# Patient Record
Sex: Male | Born: 1980 | Race: White | Hispanic: No | Marital: Single | Smoking: Current every day smoker
Health system: Southern US, Community
[De-identification: ages and names within clinical notes are randomized; demographics above are authoritative.]

## PROBLEM LIST (undated history)

## (undated) DIAGNOSIS — M24419 Recurrent dislocation, unspecified shoulder: Secondary | ICD-10-CM

## (undated) DIAGNOSIS — S069X9A Unspecified intracranial injury with loss of consciousness of unspecified duration, initial encounter: Secondary | ICD-10-CM

## (undated) DIAGNOSIS — Z87898 Personal history of other specified conditions: Secondary | ICD-10-CM

## (undated) DIAGNOSIS — S069XAA Unspecified intracranial injury with loss of consciousness status unknown, initial encounter: Secondary | ICD-10-CM

## (undated) HISTORY — PX: NO PAST SURGERIES: SHX2092

---

## 1999-10-24 ENCOUNTER — Encounter: Payer: Self-pay | Admitting: Family Medicine

## 1999-10-24 ENCOUNTER — Ambulatory Visit (HOSPITAL_COMMUNITY): Admission: RE | Admit: 1999-10-24 | Discharge: 1999-10-24 | Payer: Self-pay | Admitting: Family Medicine

## 2000-01-20 DIAGNOSIS — Z87898 Personal history of other specified conditions: Secondary | ICD-10-CM

## 2000-01-20 HISTORY — DX: Personal history of other specified conditions: Z87.898

## 2001-04-12 ENCOUNTER — Encounter: Admission: RE | Admit: 2001-04-12 | Discharge: 2001-07-11 | Payer: Self-pay | Admitting: Internal Medicine

## 2001-07-12 ENCOUNTER — Encounter: Admission: RE | Admit: 2001-07-12 | Discharge: 2001-10-10 | Payer: Self-pay | Admitting: Internal Medicine

## 2001-10-11 ENCOUNTER — Encounter: Admission: RE | Admit: 2001-10-11 | Discharge: 2001-11-22 | Payer: Self-pay | Admitting: Internal Medicine

## 2001-11-23 ENCOUNTER — Encounter: Admission: RE | Admit: 2001-11-23 | Discharge: 2002-02-21 | Payer: Self-pay | Admitting: Internal Medicine

## 2002-09-29 ENCOUNTER — Encounter: Admission: RE | Admit: 2002-09-29 | Discharge: 2002-12-28 | Payer: Self-pay

## 2005-06-08 ENCOUNTER — Ambulatory Visit: Payer: Self-pay | Admitting: Psychology

## 2005-06-08 ENCOUNTER — Encounter: Admission: RE | Admit: 2005-06-08 | Discharge: 2005-09-06 | Payer: Self-pay | Admitting: Psychology

## 2006-01-02 ENCOUNTER — Inpatient Hospital Stay (HOSPITAL_COMMUNITY): Admission: AD | Admit: 2006-01-02 | Discharge: 2006-01-09 | Payer: Self-pay | Admitting: *Deleted

## 2006-01-02 ENCOUNTER — Ambulatory Visit: Payer: Self-pay | Admitting: *Deleted

## 2010-09-02 ENCOUNTER — Emergency Department (HOSPITAL_COMMUNITY)
Admission: EM | Admit: 2010-09-02 | Discharge: 2010-09-02 | Disposition: A | Discharge status: Home / Self Care | Payer: Medicaid Other | Attending: Emergency Medicine | Admitting: Emergency Medicine

## 2010-09-02 DIAGNOSIS — L255 Unspecified contact dermatitis due to plants, except food: Secondary | ICD-10-CM | POA: Insufficient documentation

## 2010-09-02 DIAGNOSIS — T622X1A Toxic effect of other ingested (parts of) plant(s), accidental (unintentional), initial encounter: Secondary | ICD-10-CM | POA: Insufficient documentation

## 2011-01-21 ENCOUNTER — Emergency Department (HOSPITAL_COMMUNITY)
Admission: EM | Admit: 2011-01-21 | Discharge: 2011-01-22 | Discharge status: Against Medical Advice | Payer: Medicare Other | Attending: Emergency Medicine | Admitting: Emergency Medicine

## 2011-01-21 DIAGNOSIS — Z0389 Encounter for observation for other suspected diseases and conditions ruled out: Secondary | ICD-10-CM | POA: Insufficient documentation

## 2011-04-03 ENCOUNTER — Encounter (HOSPITAL_COMMUNITY): Payer: Self-pay

## 2011-04-03 ENCOUNTER — Emergency Department (HOSPITAL_COMMUNITY)
Admission: EM | Admit: 2011-04-03 | Discharge: 2011-04-03 | Disposition: A | Discharge status: Home / Self Care | Payer: Medicaid Other | Source: Home / Self Care | Attending: Family Medicine | Admitting: Family Medicine

## 2011-04-03 DIAGNOSIS — J029 Acute pharyngitis, unspecified: Secondary | ICD-10-CM

## 2011-04-03 LAB — POCT RAPID STREP A: Streptococcus, Group A Screen (Direct): NEGATIVE

## 2011-04-03 NOTE — Discharge Instructions (Signed)
Tylenol or ibuprofen as needed for discomfort. May use over-the-counter throat lozenges or Chloraseptic Spray as needed. Increase fluids. Return if symptoms change or worsen, or if no improvement in one week.  Sore Throat A sore throat is felt inside the throat and at the back of the mouth. It hurts to swallow or the throat may feel dry and scratchy. It can be caused by germs, smoking, pollution, or allergies.  HOME CARE   Only take medicine as told by your doctor.   Drink enough fluids to keep your pee (urine) clear or pale yellow.   Eat soft foods.   Do not smoke.   Rinse the mouth (gargle) with warm water or salt water ( teaspoon salt in 8 ounces of water).   Try throat sprays, lozenges, or suck on hard candy.  GET HELP RIGHT AWAY IF:   You have trouble breathing.   Your sore throat lasts longer than 1 week.   There is more puffiness (swelling) in the throat.   The pain is so bad that you are unable to swallow.   You have a very bad headache or a red rash.   You start to throw up (vomit).   You or your child has a temperature by mouth above 102 F (38.9 C), not controlled by medicine.   Your baby is older than 3 months with a rectal temperature of 102 F (38.9 C) or higher.   Your baby is 67 months old or younger with a rectal temperature of 100.4 F (38 C) or higher.  MAKE SURE YOU:   Understand these instructions.   Will watch your condition.   Will get help right away if you are not doing well or get worse.  Document Released: 10/15/2007 Document Revised: 12/25/2010 Document Reviewed: 10/15/2007 [facility] Patient Information 2012 [place], [place].

## 2011-04-03 NOTE — ED Provider Notes (Signed)
History     CSN: [id number]  Arrival date & time 04/03/11  1955   None     Chief Complaint  Patient presents with  . Sore Throat    (Consider location/radiation/quality/duration/timing/severity/associated sxs/prior treatment) HPI Comments: Emily presents today with complaints of sore throat and swollen lymph nodes for the last 3 days. He states that the sore throat worsens with swallowing. He denies any nasal congestion, postnasal drainage or cough. He has not taken anything for his symptoms.   History reviewed. No pertinent past medical history.  History reviewed. No pertinent past surgical history.  History reviewed. No pertinent family history.  History  Substance Use Topics  . Smoking status: Current Everyday Smoker  . Smokeless tobacco: Not on file  . Alcohol Use: Yes      Review of Systems  Constitutional: Negative for fever, chills and fatigue.  HENT: Positive for sore throat. Negative for ear pain, rhinorrhea, postnasal drip and sinus pressure.   Respiratory: Negative for cough.   Cardiovascular: Negative for chest pain.    Allergies  Review of patient's allergies indicates no known allergies.  Home Medications  No current outpatient prescriptions on file.  BP 143/88  Pulse 80  Temp(Src) 99.6 F (37.6 C) (Oral)  Resp 18  SpO2 99%  Physical Exam  Nursing note and vitals reviewed. Constitutional: He appears well-developed and well-nourished. No distress.  HENT:  Head: Normocephalic and atraumatic.  Right Ear: Tympanic membrane, external ear and ear canal normal.  Left Ear: Tympanic membrane, external ear and ear canal normal.  Nose: Nose normal.  Mouth/Throat: Uvula is midline, oropharynx is clear and moist and mucous membranes are normal. No oropharyngeal exudate, posterior oropharyngeal edema or posterior oropharyngeal erythema.  Neck: Neck supple.  Cardiovascular: Normal rate, regular rhythm and normal heart sounds.   Pulmonary/Chest: Effort  normal and breath sounds normal. No respiratory distress.  Lymphadenopathy:       Head (right side): Submental and submandibular adenopathy present. No tonsillar adenopathy present.       Head (left side): Submental and submandibular adenopathy present. No tonsillar adenopathy present.    He has no cervical adenopathy.  Neurological: He is alert.  Skin: Skin is warm and dry.  Psychiatric: He has a normal mood and affect.    ED Course  Procedures (including critical care time)   Labs Reviewed  POCT RAPID STREP A ([facility] URG CARE ONLY)   No results found.   1. Acute pharyngitis       MDM  Rapid strep neg.         Melody Comas, [place] 04/03/11 2132

## 2011-04-03 NOTE — ED Notes (Signed)
C/o sore throat and swollen lymph nodes for 3 days.

## 2011-04-04 NOTE — ED Provider Notes (Signed)
Medical screening examination/treatment/procedure(s) were performed by non-physician practitioner and as supervising physician I was immediately available for consultation/collaboration.   [facility]; MD   Sharin Grave, MD 04/04/11 [phone]

## 2011-07-09 ENCOUNTER — Encounter (HOSPITAL_COMMUNITY): Payer: Self-pay | Admitting: *Deleted

## 2011-07-09 ENCOUNTER — Emergency Department (HOSPITAL_COMMUNITY)
Admission: EM | Admit: 2011-07-09 | Discharge: 2011-07-09 | Disposition: A | Discharge status: Home / Self Care | Payer: Medicare Other | Attending: Emergency Medicine | Admitting: Emergency Medicine

## 2011-07-09 DIAGNOSIS — F17200 Nicotine dependence, unspecified, uncomplicated: Secondary | ICD-10-CM | POA: Insufficient documentation

## 2011-07-09 DIAGNOSIS — S90569A Insect bite (nonvenomous), unspecified ankle, initial encounter: Secondary | ICD-10-CM | POA: Insufficient documentation

## 2011-07-09 DIAGNOSIS — W57XXXA Bitten or stung by nonvenomous insect and other nonvenomous arthropods, initial encounter: Secondary | ICD-10-CM

## 2011-07-09 LAB — POCT I-STAT, CHEM 8
BUN: 21 mg/dL (ref 6–23)
Calcium, Ion: 1.21 mmol/L (ref 1.12–1.32)
Chloride: 107 mEq/L (ref 96–112)
Creatinine, Ser: 1.1 mg/dL (ref 0.50–1.35)
Glucose, Bld: 86 mg/dL (ref 70–99)
HCT: 48 % (ref 39.0–52.0)
Hemoglobin: 16.3 g/dL (ref 13.0–17.0)
Potassium: 4.5 mEq/L (ref 3.5–5.1)
Sodium: 144 mEq/L (ref 135–145)
TCO2: 25 mmol/L (ref 0–100)

## 2011-07-09 MED ORDER — DOXYCYCLINE HYCLATE 100 MG PO CAPS: 100.0000 mg | ORAL_CAPSULE | Freq: Two times a day (BID) | ORAL | Status: AC

## 2011-07-09 NOTE — Discharge Instructions (Signed)
Wesley Elliott prescribed to get a primary care doctor. Try to cut down the amount of alcohol that you've consumed. It may be interfering with his sleep pattern. RESOURCE GUIDE  Chronic Pain Problems: Contact [facility]  [phone] Patients need to be referred by their primary care doctor.  Insufficient Money for Medicine: Contact United Way:  call "211" or [facility] [phone].  No Primary Care Doctor: - Call [facility]  [phone] - can help you locate a primary care doctor that  accepts your insurance, provides certain services, etc. - Physician Referral Service- [phone]  Agencies that provide inexpensive medical care: - [facility]  [phone] - [facility]  [phone] - [facility]  [phone] - Women's Clinic  [phone] - [facility] Parenthood  [phone] [facility]  [phone]  Medicaid-accepting [facility] Providers: - [facility]- [address]  [phone], Mon-Fri 9am-7pm, Sat 9am-1pm - [facility]- [address] [place], [address] [place]  [phone] - [facility]- [address], [address] [place]  [phone] [facility]- [address]  [phone] - Renaye Rakers- [address] [place], [address], [phone]  Only accepts [place] patients after they have their name  applied to their card  Self Pay (no insurance) in [place]: - Sickle Cell Patients: Dr Willey Blade, [facility]  [address] [place], [phone] - [facility]- [address] [place]  [phone]       [facility] [place]- [address]       -     [facility]- see information above (Speak to [employer] if you do not have insurance)       -  [facility]- [address] [place], [phone]       -  [facility]- [address],  [phone]       -   [facility]- [address] [phone]       -  Dr Julio Sicks-  [address], [place], [phone]       -  [facility]- [address], [phone]       -  [facility]- [address], [phone], also [address], [phone]       -    [facility]- [address] [place], [phone], 1st & 3rd Saturday   every month, 10am-1pm  1) Find a Doctor and Pay Out of Pocket Although you won't have to find out who is covered by your insurance plan, it is a good idea to ask around and get recommendations. You will then need to call the office and see if the doctor you have chosen will accept you as a new patient and what types of options they offer for patients who are self-pay. Some doctors offer discounts or will set up payment plans for their patients who do not have insurance, but you will need to ask so you aren't surprised when you get to your appointment.  2) Contact Your Local Health Department Not all health departments have doctors that can see patients for sick visits, but many do, so it is worth a call to see if yours does. If you don't know where your local health department  is, you can check in your phone book. The CDC also has a tool to help you locate your state's health department, and many state websites also have listings of all of their local health departments.  3) Find a Walk-in Clinic If your illness is not likely to be very severe or complicated, you may want to try a walk in clinic. These are popping up all over the country in pharmacies, drugstores, and shopping centers. They're usually staffed by nurse practitioners or physician assistants that have been trained to treat common illnesses and complaints. They're usually fairly quick and inexpensive. However, if you have serious medical issues or chronic medical problems, these are probably not your best option  STD Testing - [facility] Department of [facility], STD Clinic, [address] [place], phone [phone] or [phone].  Monday - Friday, call for an appointment. [facility] Department of [employer], STD Clinic, [address], [place], phone [phone] or [phone].  Monday - Friday, call for an appointment.  Abuse/Neglect: [facility] Child Abuse Hotline 8501710895 [facility] Child Abuse Hotline [phone] (After Hours)  Emergency Shelter:  [facility] [phone]  Maternity Homes: - [facility] at the [place] [facility] [phone] - [facility] 3027146264  MRSA Hotline #:   403 316 5885  [facility] Resources  [facility] [place]  [facility] Dept. [address]                 [address]         [address] [place] [address]  Blondell Reveal Phone:  [phone]                                  Phone:  [phone]                   Phone:  [phone]  [facility], [phone] - [phone]       -     [facility] in [place], [address],                                  [phone], [facility] Child Abuse Hotline 612-500-4207 or 785-860-1996 (After Hours)   Behavioral Health Services  Substance Abuse Resources: - [facility] Drug Services  ([phone] - [facility] [phone] - [facility] [place] [phone] Floydene Flock [phone] - Residential & Outpatient Substance Abuse Program  ([phone]  Psychological Services: [facility]  [phone] Services  [phone] - [facility], [phone] [place]. [address] [place], ACCESS LINE: (548) [phone] or [phone],  [employer]ur[employer]an.co.za  Dental Assistance  If unable to pay or uninsured, contact:  [facility] or [facility]. to become  qualified for the adult dental clinic.  Patients with Medicaid: [facility] [phone] [address], [phone] [address] [phone]  If unable to pay, or uninsured, contact [facility] (740) [phone]) or [facility] Department [phone] in [place], [phone] in [facility]) to become qualified for the adult dental clinic  Other Low-Cost Community Dental Services: - [facility]- [address] [place], [place], [place], [address], [phone], Ext. [phone], 2nd and 4th Thursday of the month at 6:30am.  10 clients each day by appointment, can sometimes see walk-in patients if someone does not show for an appointment. [facility]- [address] [place], [place], [address], [phone] - [facility]- [address] [place], [place], [address], [phone] - [facility]- [phone] [facility] Health Department- [phone] [facility] Department- [phone]

## 2011-07-09 NOTE — ED Notes (Signed)
Patient reports he removed a tick from his right lower leg 3 days ago.  He developed what he describes as flu like sx.  Patient states the area is red as well.

## 2011-07-09 NOTE — ED Provider Notes (Signed)
History     CSN: [id number]  Arrival date & time 07/09/11  1318   First MD Initiated Contact with Patient 07/09/11 1657      Chief Complaint  Patient presents with  . Insect Bite  . Dizziness  . Blurred Vision  . Fever    (Consider location/radiation/quality/duration/timing/severity/associated sxs/prior treatment) HPI Patient was bitten by a tick on his right posterior knee one week ago since the event he has developed "flulike symptoms "meaning subjective fever generalized weakness reports blurred vision from his right eye which lasted for 5 minutes earlier today and had ringing in his ear which lasted 30 seconds earlier today. Treated himself with TheraFlu last night no other complaint no other associated symptoms History reviewed. No pertinent past medical history. Brain injury History reviewed. No pertinent past surgical history.  No family history on file.  History  Substance Use Topics  . Smoking status: Current Everyday Smoker  . Smokeless tobacco: Not on file  . Alcohol Use: Yes   Drinks 24 beers and two fiths of crown royal every weekend  Review of Systems  HENT: Positive for tinnitus.   Eyes: Positive for visual disturbance.  Respiratory: Negative.   Cardiovascular: Negative.   Gastrointestinal: Negative.   Musculoskeletal: Negative.   Skin: Negative.   Neurological: Positive for weakness.       Insomnia for one year  Hematological: Negative.   Psychiatric/Behavioral: Negative.     Allergies  Codeine  Home Medications   Current Outpatient Rx  Name Route Sig Dispense Refill  . MELATONIN GUMMIES 2.5 MG PO CHEW Oral Chew 1 tablet by mouth at bedtime.      BP 124/82  Pulse 98  Temp 98.4 F (36.9 C) (Oral)  Resp 18  Ht 5\' 10"  (1.778 m)  Wt 220 lb (99.791 kg)  BMI 31.57 kg/m2  SpO2 100%  Physical Exam  Nursing note and vitals reviewed. Constitutional: He appears well-developed and well-nourished.  HENT:  Head: Normocephalic and atraumatic.    Eyes: Conjunctivae are normal. Pupils are equal, round, and reactive to light.  Neck: Neck supple. No tracheal deviation present. No thyromegaly present.  Cardiovascular: Normal rate and regular rhythm.   No murmur heard. Pulmonary/Chest: Effort normal and breath sounds normal.  Abdominal: Soft. Bowel sounds are normal. He exhibits no distension. There is no tenderness.  Musculoskeletal: Normal range of motion. He exhibits no edema and no tenderness.  Neurological: He is alert. Coordination normal.  Skin: Skin is warm and dry. No rash noted.       There is a single dime sized lesion at right posterior knee suggestive of a bug bite no other rash  Psychiatric: He has a normal mood and affect.    ED Course  Procedures (including critical care time)  Labs Reviewed - No data to display No results found.   No diagnosis found.  5:20 PM patient asymptomatic alert appropriate Glasgow Coma Score 15 appears comfortable  MDM  In light of vague symptoms following tick bite we'll treat with antibiotic Plan prescription doxycycline Referral resource guide Diagnosis #1 tick bite        Wesley Sou, MD 07/09/11 1824

## 2011-09-18 ENCOUNTER — Emergency Department (HOSPITAL_COMMUNITY)
Admission: EM | Admit: 2011-09-18 | Discharge: 2011-09-18 | Disposition: A | Discharge status: Home / Self Care | Payer: Medicare Other | Attending: Emergency Medicine | Admitting: Emergency Medicine

## 2011-09-18 ENCOUNTER — Encounter (HOSPITAL_COMMUNITY): Payer: Self-pay | Admitting: Emergency Medicine

## 2011-09-18 ENCOUNTER — Emergency Department (HOSPITAL_COMMUNITY): Payer: Medicare Other

## 2011-09-18 DIAGNOSIS — IMO0002 Reserved for concepts with insufficient information to code with codable children: Secondary | ICD-10-CM | POA: Insufficient documentation

## 2011-09-18 DIAGNOSIS — F17200 Nicotine dependence, unspecified, uncomplicated: Secondary | ICD-10-CM | POA: Diagnosis not present

## 2011-09-18 DIAGNOSIS — Z886 Allergy status to analgesic agent status: Secondary | ICD-10-CM | POA: Diagnosis not present

## 2011-09-18 DIAGNOSIS — T07XXXA Unspecified multiple injuries, initial encounter: Secondary | ICD-10-CM

## 2011-09-18 DIAGNOSIS — T148XXA Other injury of unspecified body region, initial encounter: Secondary | ICD-10-CM

## 2011-09-18 MED ORDER — CYCLOBENZAPRINE HCL 10 MG PO TABS: 10.0000 mg | ORAL_TABLET | Freq: Three times a day (TID) | ORAL | Status: AC | PRN

## 2011-09-18 MED ORDER — NAPROXEN 500 MG PO TABS: 500.0000 mg | ORAL_TABLET | Freq: Two times a day (BID) | ORAL | Status: AC
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
## 2011-09-18 NOTE — ED Notes (Signed)
Prescription x2 given with discharge instructions.  
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
## 2011-09-19 NOTE — ED Provider Notes (Signed)
Medical screening examination/treatment/procedure(s) were performed by non-physician practitioner and as supervising physician I was immediately available for consultation/collaboration.   Marylin Lathon T Tadeo Besecker, MD 09/19/11 2312 

## 2011-11-09 DIAGNOSIS — F09 Unspecified mental disorder due to known physiological condition: Secondary | ICD-10-CM

## 2011-11-09 DIAGNOSIS — F39 Unspecified mood [affective] disorder: Secondary | ICD-10-CM

## 2013-02-09 ENCOUNTER — Other Ambulatory Visit: Payer: Self-pay | Admitting: Orthopedic Surgery

## 2013-02-09 DIAGNOSIS — M25519 Pain in unspecified shoulder: Secondary | ICD-10-CM | POA: Diagnosis not present

## 2013-02-09 DIAGNOSIS — M25511 Pain in right shoulder: Secondary | ICD-10-CM

## 2013-02-19 DIAGNOSIS — M24419 Recurrent dislocation, unspecified shoulder: Secondary | ICD-10-CM

## 2013-02-19 HISTORY — DX: Recurrent dislocation, unspecified shoulder: M24.419

## 2013-02-20 ENCOUNTER — Ambulatory Visit
Admission: RE | Admit: 2013-02-20 | Discharge: 2013-02-20 | Disposition: A | Discharge status: Home / Self Care | Payer: Medicare Other | Source: Ambulatory Visit | Attending: Orthopedic Surgery | Admitting: Orthopedic Surgery

## 2013-02-20 DIAGNOSIS — M25511 Pain in right shoulder: Secondary | ICD-10-CM

## 2013-02-20 DIAGNOSIS — S43026A Posterior dislocation of unspecified humerus, initial encounter: Secondary | ICD-10-CM | POA: Diagnosis not present

## 2013-02-20 MED ORDER — IOHEXOL 180 MG/ML  SOLN
15.0000 mL | Freq: Once | INTRAMUSCULAR | Status: AC | PRN
  Administered 2013-02-20: 15 mL via INTRA_ARTICULAR

## 2013-02-23 DIAGNOSIS — M25519 Pain in unspecified shoulder: Secondary | ICD-10-CM | POA: Diagnosis not present

## 2013-03-02 DIAGNOSIS — M25519 Pain in unspecified shoulder: Secondary | ICD-10-CM | POA: Diagnosis not present

## 2013-03-02 DIAGNOSIS — R109 Unspecified abdominal pain: Secondary | ICD-10-CM | POA: Diagnosis not present

## 2013-03-10 ENCOUNTER — Encounter (HOSPITAL_BASED_OUTPATIENT_CLINIC_OR_DEPARTMENT_OTHER): Payer: Self-pay | Admitting: *Deleted

## 2013-03-10 NOTE — Progress Notes (Signed)
03/10/13 1523  OBSTRUCTIVE SLEEP APNEA  Have you ever been diagnosed with sleep apnea through a sleep study? No  Do you snore loudly (loud enough to be heard through closed doors)?  1  Do you often feel tired, fatigued, or sleepy during the daytime? 1  Has anyone observed you stop breathing during your sleep? 0  Do you have, or are you being treated for high blood pressure? 0  BMI more than 35 kg/m2? 1  Age over 35 years old? 0  Gender: 1  Obstructive Sleep Apnea Score 4  Score 4 or greater  Results sent to PCP [facility]ie Hab[facility] at [facility])

## 2013-03-14 ENCOUNTER — Other Ambulatory Visit: Payer: Self-pay | Admitting: Physician Assistant
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
## 2013-03-17 ENCOUNTER — Ambulatory Visit (HOSPITAL_BASED_OUTPATIENT_CLINIC_OR_DEPARTMENT_OTHER)
Admission: RE | Admit: 2013-03-17 | Discharge: 2013-03-17 | Disposition: A | Discharge status: Home / Self Care | Payer: Medicare Other | Source: Ambulatory Visit | Attending: Orthopedic Surgery | Admitting: Orthopedic Surgery

## 2013-03-17 ENCOUNTER — Encounter (HOSPITAL_BASED_OUTPATIENT_CLINIC_OR_DEPARTMENT_OTHER): Payer: Self-pay | Admitting: *Deleted

## 2013-03-17 ENCOUNTER — Ambulatory Visit (HOSPITAL_BASED_OUTPATIENT_CLINIC_OR_DEPARTMENT_OTHER): Payer: Medicare Other | Admitting: Anesthesiology

## 2013-03-17 ENCOUNTER — Encounter (HOSPITAL_BASED_OUTPATIENT_CLINIC_OR_DEPARTMENT_OTHER)
Admission: RE | Disposition: A | Discharge status: Home / Self Care | Payer: Self-pay | Source: Ambulatory Visit | Attending: Orthopedic Surgery

## 2013-03-17 ENCOUNTER — Encounter (HOSPITAL_BASED_OUTPATIENT_CLINIC_OR_DEPARTMENT_OTHER): Payer: Medicare Other | Admitting: Anesthesiology

## 2013-03-17 DIAGNOSIS — Z87820 Personal history of traumatic brain injury: Secondary | ICD-10-CM | POA: Diagnosis not present

## 2013-03-17 DIAGNOSIS — M19019 Primary osteoarthritis, unspecified shoulder: Secondary | ICD-10-CM | POA: Diagnosis not present

## 2013-03-17 DIAGNOSIS — M24419 Recurrent dislocation, unspecified shoulder: Secondary | ICD-10-CM

## 2013-03-17 DIAGNOSIS — M7580 Other shoulder lesions, unspecified shoulder: Secondary | ICD-10-CM

## 2013-03-17 DIAGNOSIS — S43006A Unspecified dislocation of unspecified shoulder joint, initial encounter: Secondary | ICD-10-CM | POA: Diagnosis not present

## 2013-03-17 DIAGNOSIS — M25819 Other specified joint disorders, unspecified shoulder: Secondary | ICD-10-CM | POA: Diagnosis not present

## 2013-03-17 DIAGNOSIS — F17200 Nicotine dependence, unspecified, uncomplicated: Secondary | ICD-10-CM | POA: Insufficient documentation

## 2013-03-17 DIAGNOSIS — M24119 Other articular cartilage disorders, unspecified shoulder: Secondary | ICD-10-CM | POA: Insufficient documentation

## 2013-03-17 HISTORY — DX: Unspecified intracranial injury with loss of consciousness status unknown, initial encounter: S06.9XAA

## 2013-03-17 HISTORY — PX: SHOULDER ACROMIOPLASTY: SHX6093

## 2013-03-17 HISTORY — DX: Recurrent dislocation, unspecified shoulder: M24.419

## 2013-03-17 HISTORY — DX: Unspecified intracranial injury with loss of consciousness of unspecified duration, initial encounter: S06.9X9A

## 2013-03-17 HISTORY — DX: Personal history of other specified conditions: Z87.898

## 2013-03-17 HISTORY — PX: SHOULDER ARTHROSCOPY: SHX128

## 2013-03-17 LAB — POCT HEMOGLOBIN-HEMACUE: Hemoglobin: 16.3 g/dL (ref 13.0–17.0)

## 2013-03-17 SURGERY — SHOULDER ACROMIOPLASTY
Anesthesia: General | Site: Shoulder | Laterality: Right

## 2013-03-17 MED ORDER — MIDAZOLAM HCL 2 MG/2ML IJ SOLN
INTRAMUSCULAR | Status: AC
  Filled 2013-03-17: qty 2

## 2013-03-17 MED ORDER — MIDAZOLAM HCL 2 MG/2ML IJ SOLN
1.0000 mg | INTRAMUSCULAR | Status: DC | PRN
  Administered 2013-03-17: 2 mg via INTRAVENOUS

## 2013-03-17 MED ORDER — FENTANYL CITRATE 0.05 MG/ML IJ SOLN
50.0000 ug | INTRAMUSCULAR | Status: DC | PRN
  Administered 2013-03-17: 100 ug via INTRAVENOUS

## 2013-03-17 MED ORDER — SODIUM CHLORIDE 0.9 % IV SOLN: INTRAVENOUS | Status: DC

## 2013-03-17 MED ORDER — CEFAZOLIN SODIUM-DEXTROSE 2-3 GM-% IV SOLR
INTRAVENOUS | Status: DC | PRN
  Administered 2013-03-17: 2 g via INTRAVENOUS

## 2013-03-17 MED ORDER — LIDOCAINE HCL 4 % MT SOLN
OROMUCOSAL | Status: DC | PRN
  Administered 2013-03-17: 4 mL via TOPICAL

## 2013-03-17 MED ORDER — SODIUM CHLORIDE 0.9 % IR SOLN
Status: DC | PRN
  Administered 2013-03-17: 6000 mL

## 2013-03-17 MED ORDER — PROPOFOL 10 MG/ML IV BOLUS
INTRAVENOUS | Status: DC | PRN
  Administered 2013-03-17: 200 mg via INTRAVENOUS

## 2013-03-17 MED ORDER — CEFAZOLIN SODIUM-DEXTROSE 2-3 GM-% IV SOLR
2.0000 g | INTRAVENOUS | Status: AC
  Administered 2013-03-17: 2 g via INTRAVENOUS

## 2013-03-17 MED ORDER — FENTANYL CITRATE 0.05 MG/ML IJ SOLN
INTRAMUSCULAR | Status: AC
  Filled 2013-03-17: qty 2

## 2013-03-17 MED ORDER — ALBUTEROL SULFATE HFA 108 (90 BASE) MCG/ACT IN AERS
INHALATION_SPRAY | RESPIRATORY_TRACT | Status: DC | PRN
  Administered 2013-03-17: 5 via RESPIRATORY_TRACT

## 2013-03-17 MED ORDER — ONDANSETRON HCL 4 MG/2ML IJ SOLN
INTRAMUSCULAR | Status: DC | PRN
Start: 2013-03-17 — End: 2013-03-17
  Administered 2013-03-17: 4 mg via INTRAVENOUS

## 2013-03-17 MED ORDER — CEFAZOLIN SODIUM-DEXTROSE 2-3 GM-% IV SOLR
INTRAVENOUS | Status: AC
  Filled 2013-03-17: qty 50

## 2013-03-17 MED ORDER — DEXAMETHASONE SODIUM PHOSPHATE 4 MG/ML IJ SOLN
INTRAMUSCULAR | Status: DC | PRN
  Administered 2013-03-17: 10 mg via INTRAVENOUS

## 2013-03-17 MED ORDER — SUCCINYLCHOLINE CHLORIDE 20 MG/ML IJ SOLN
INTRAMUSCULAR | Status: DC | PRN
  Administered 2013-03-17: 100 mg via INTRAVENOUS

## 2013-03-17 MED ORDER — LACTATED RINGERS IV SOLN
INTRAVENOUS | Status: DC
  Administered 2013-03-17: 09:00:00 via INTRAVENOUS
  Administered 2013-03-17: 10 mL/h via INTRAVENOUS

## 2013-03-17 MED ORDER — MIDAZOLAM HCL 2 MG/ML PO SYRP: 12.0000 mg | ORAL_SOLUTION | Freq: Once | ORAL | Status: DC | PRN

## 2013-03-17 MED ORDER — CHLORHEXIDINE GLUCONATE 4 % EX LIQD: 60.0000 mL | Freq: Once | CUTANEOUS | Status: DC

## 2013-03-17 SURGICAL SUPPLY — 81 items
APL SKNCLS STERI-STRIP NONHPOA (GAUZE/BANDAGES/DRESSINGS)
BENZOIN TINCTURE PRP APPL 2/3 (GAUZE/BANDAGES/DRESSINGS) IMPLANT
BLADE 4.2CUDA (BLADE) ×3 IMPLANT
BLADE CUTTER GATOR 3.5 (BLADE) IMPLANT
BLADE SURG 10 STRL SS (BLADE) IMPLANT
BLADE SURG 15 STRL LF DISP TIS (BLADE) IMPLANT
BLADE SURG 15 STRL SS (BLADE) ×3
BLADE VORTEX 6.0 (BLADE) IMPLANT
BUR 3.5 LG SPHERICAL (BURR) IMPLANT
BUR EGG 3PK/BX (BURR) IMPLANT
BUR VERTEX HOODED 4.5 (BURR) IMPLANT
BURR 3.5 LG SPHERICAL (BURR)
BURR 3.5MM LG SPHERICAL (BURR)
CANISTER SUCT 3000ML (MISCELLANEOUS) IMPLANT
CANNULA 5.75X71 LONG (CANNULA) IMPLANT
CANNULA SHOULDER 7CM (CANNULA) ×3 IMPLANT
CANNULA TWIST IN 8.25X7CM (CANNULA) IMPLANT
CLEANER CAUTERY TIP 5X5 PAD (MISCELLANEOUS) IMPLANT
CLOSURE WOUND 1/2 X4 (GAUZE/BANDAGES/DRESSINGS)
CUTTER MENISCUS  4.2MM (BLADE)
CUTTER MENISCUS 4.2MM (BLADE) IMPLANT
DECANTER SPIKE VIAL GLASS SM (MISCELLANEOUS) IMPLANT
DRAPE STERI 35X30 U-POUCH (DRAPES) ×3 IMPLANT
DRAPE SURG 17X23 STRL (DRAPES) ×5 IMPLANT
DRAPE U-SHAPE 76X120 STRL (DRAPES) ×6 IMPLANT
DRSG EMULSION OIL 3X3 NADH (GAUZE/BANDAGES/DRESSINGS) ×2 IMPLANT
DURAPREP 26ML APPLICATOR (WOUND CARE) ×3 IMPLANT
ELECT REM PT RETURN 9FT ADLT (ELECTROSURGICAL)
ELECTRODE REM PT RTRN 9FT ADLT (ELECTROSURGICAL) ×1 IMPLANT
GLOVE BIO SURGEON STRL SZ7.5 (GLOVE) IMPLANT
GLOVE BIOGEL M STRL SZ7.5 (GLOVE) ×2 IMPLANT
GLOVE BIOGEL PI IND STRL 8 (GLOVE) ×2 IMPLANT
GLOVE BIOGEL PI INDICATOR 8 (GLOVE) ×6
GLOVE SURG ORTHO 8.0 STRL STRW (GLOVE) ×3 IMPLANT
GOWN STRL REUS W/ TWL LRG LVL3 (GOWN DISPOSABLE) ×2 IMPLANT
GOWN STRL REUS W/ TWL XL LVL3 (GOWN DISPOSABLE) ×1 IMPLANT
GOWN STRL REUS W/TWL LRG LVL3 (GOWN DISPOSABLE) ×6
GOWN STRL REUS W/TWL XL LVL3 (GOWN DISPOSABLE) ×3
LASSO SUT 90 DEGREE (SUTURE) IMPLANT
MANIFOLD NEPTUNE II (INSTRUMENTS) ×3 IMPLANT
NDL 1/2 CIR CATGUT .05X1.09 (NEEDLE) IMPLANT
NEEDLE 1/2 CIR CATGUT .05X1.09 (NEEDLE) IMPLANT
NS IRRIG 1000ML POUR BTL (IV SOLUTION) ×1 IMPLANT
PACK ARTHROSCOPY DSU (CUSTOM PROCEDURE TRAY) ×3 IMPLANT
PACK BASIN DAY SURGERY FS (CUSTOM PROCEDURE TRAY) ×3 IMPLANT
PAD ABD 8X10 STRL (GAUZE/BANDAGES/DRESSINGS) ×3 IMPLANT
PAD CLEANER CAUTERY TIP 5X5 (MISCELLANEOUS)
PENCIL BUTTON HOLSTER BLD 10FT (ELECTRODE) IMPLANT
SET ARTHROSCOPY TUBING (MISCELLANEOUS) ×3
SET ARTHROSCOPY TUBING LN (MISCELLANEOUS) ×1 IMPLANT
SLING ARM LRG ADULT FOAM STRAP (SOFTGOODS) IMPLANT
SLING ARM MED ADULT FOAM STRAP (SOFTGOODS) IMPLANT
SLING ULTRA II LARGE (SOFTGOODS) IMPLANT
SLING ULTRA II MEDIUM (SOFTGOODS) IMPLANT
SLING ULTRA II SMALL (SOFTGOODS) IMPLANT
SPONGE GAUZE 4X4 12PLY (GAUZE/BANDAGES/DRESSINGS) ×3 IMPLANT
SPONGE LAP 4X18 X RAY DECT (DISPOSABLE) IMPLANT
STAPLER VISISTAT 35W (STAPLE) IMPLANT
STRIP CLOSURE SKIN 1/2X4 (GAUZE/BANDAGES/DRESSINGS) IMPLANT
SUCTION FRAZIER TIP 10 FR DISP (SUCTIONS) IMPLANT
SUT BONE WAX W31G (SUTURE) IMPLANT
SUT ETHIBOND 2 OS 4 DA (SUTURE) IMPLANT
SUT ETHILON 4 0 PS 2 18 (SUTURE) ×3 IMPLANT
SUT FIBERWIRE #2 38 T-5 BLUE (SUTURE)
SUT LASSO 45 DEGREE (SUTURE) IMPLANT
SUT LASSO 45 DEGREE LEFT (SUTURE) IMPLANT
SUT LASSO 45D RIGHT (SUTURE) IMPLANT
SUT PROLENE 3 0 PS 2 (SUTURE) IMPLANT
SUT TICRON 1 T 12 (SUTURE) IMPLANT
SUT VIC AB 0 CT1 27 (SUTURE)
SUT VIC AB 0 CT1 27XBRD ANBCTR (SUTURE) IMPLANT
SUT VIC AB 1 CT1 27 (SUTURE)
SUT VIC AB 1 CT1 27XBRD ANBCTR (SUTURE) IMPLANT
SUT VIC AB 2-0 PS2 27 (SUTURE) IMPLANT
SUT VIC AB 2-0 SH 27 (SUTURE)
SUT VIC AB 2-0 SH 27XBRD (SUTURE) IMPLANT
SUTURE FIBERWR #2 38 T-5 BLUE (SUTURE) IMPLANT
TOWEL OR 17X24 6PK STRL BLUE (TOWEL DISPOSABLE) ×3 IMPLANT
WAND STAR VAC 90 (SURGICAL WAND) IMPLANT
WATER STERILE IRR 1000ML POUR (IV SOLUTION) ×3 IMPLANT
YANKAUER SUCT BULB TIP NO VENT (SUCTIONS) IMPLANT
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
## 2013-03-17 NOTE — Brief Op Note (Signed)
03/17/2013  10:48 AM  PATIENT:  Wesley Elliott  33 y.o. male  PRE-OPERATIVE DIAGNOSIS:  recurrent shoulder dislocation  POST-OPERATIVE DIAGNOSIS:  recurrent shoulder dislocation  PROCEDURE:  Procedure(s): RIGHT SHOULDER ACROMIOPLASTY (Right) ARTHROSCOPY RIGHT SHOULDER WITH DEBRIDEMENT (Right)  SURGEON:  Surgeon(s) and Role:    * [name] Valeta Harms., MD - Primary  PHYSICIAN ASSISTANT: Chriss Czar, PA-C  ASSISTANTS:  ANESTHESIA:   regional and general  EBL:  Total I/O In: 1000 [I.V.:1000] Out: -   BLOOD ADMINISTERED:none  DRAINS: none   LOCAL MEDICATIONS USED:  NONE  SPECIMEN:  No Specimen  DISPOSITION OF SPECIMEN:  N/A  COUNTS:  YES  TOURNIQUET:  * No tourniquets in log *  DICTATION: .Other Dictation: Dictation Number unknown  PLAN OF CARE: Discharge to home after PACU  PATIENT DISPOSITION:  PACU - hemodynamically stable.   Delay start of Pharmacological VTE agent (>24hrs) due to surgical blood loss or risk of bleeding: not applicable

## 2013-03-17 NOTE — Transfer of Care (Signed)
Immediate Anesthesia Transfer of Care Note  Patient: Wesley Elliott  Procedure(s) Performed: Procedure(s): RIGHT SHOULDER ACROMIOPLASTY (Right) ARTHROSCOPY RIGHT SHOULDER WITH DEBRIDEMENT (Right)  Patient Location: PACU  Anesthesia Type:GA combined with regional for post-op pain  Level of Consciousness: sedated  Airway & Oxygen Therapy: Patient Spontanous Breathing and Patient connected to face mask oxygen  Post-op Assessment: Report given to PACU RN and Post -op Vital signs reviewed and stable  Post vital signs: Reviewed and stable  Complications: No apparent anesthesia complications

## 2013-03-17 NOTE — Discharge Instructions (Signed)
Post Anesthesia Home Care Instructions  Activity: Get plenty of rest for the remainder of the day. A responsible adult should stay with you for 24 hours following the procedure.  For the next 24 hours, DO NOT: -Drive a car -Paediatric nurse -Drink alcoholic beverages -Take any medication unless instructed by your physician -Make any legal decisions or sign important papers.  Meals: Start with liquid foods such as gelatin or soup. Progress to regular foods as tolerated. Avoid greasy, spicy, heavy foods. If nausea and/or vomiting occur, drink only clear liquids until the nausea and/or vomiting subsides. Call your physician if vomiting continues.  Special Instructions/Symptoms: Your throat may feel dry or sore from the anesthesia or the breathing tube placed in your throat during surgery. If this causes discomfort, gargle with warm salt water. The discomfort should disappear within 24 hours.      Regional Anesthesia Blocks  1. Numbness or the inability to move the "blocked" extremity may last from 3-48 hours after placement. The length of time depends on the medication injected and your individual response to the medication. If the numbness is not going away after 48 hours, call your surgeon.  2. The extremity that is blocked will need to be protected until the numbness is gone and the  Strength has returned. Because you cannot feel it, you will need to take extra care to avoid injury. Because it may be weak, you may have difficulty moving it or using it. You may not know what position it is in without looking at it while the block is in effect.  3. For blocks in the legs and feet, returning to weight bearing and walking needs to be done carefully. You will need to wait until the numbness is entirely gone and the strength has returned. You should be able to move your leg and foot normally before you try and bear weight or walk. You will need someone to be with you when you first try to  ensure you do not fall and possibly risk injury.  4. Bruising and tenderness at the needle site are common side effects and will resolve in a few days.  5. Persistent numbness or new problems with movement should be communicated to the surgeon or the [place] [phone] [place]ita [phone]).     Diet: As you were doing prior to hospitalization   Activity:  Increase activity slowly as tolerated                  No lifting or driving for 2 weeks  Shower:  May shower without a dressing in 48 hours, ok to use soap and water, NO SOAKING in tub  Dressing:  You may change your dressing following shower, keep covered until f/u bandaids are fine  Weight Bearing/Sling:  No lifting with right arm, may come out of sling to shower  To prevent constipation: you may use a stool softener such as -               Colace ( over the counter) 100 mg by mouth twice a day                Drink plenty of fluids ( prune juice may be helpful) and high fiber foods                Miralax ( over the counter) for constipation as needed.    Precautions:  If you experience chest pain or shortness of breath -  call 911 immediately               For transfer to the hospital emergency department!!               If you develop a fever greater that 101 F, purulent drainage from wound,                             increased redness or drainage from wound, or calf pain -- Call the office                                               Follow- Up Appointment:  Please call for an appointment to be seen next week               [facility] - [phone]  Pain Control:  It is not unusual to have a rather sudden onset of pain after the anasthesia block wears off within the first 24 hours from surgery, make sure you are taking the pain medication prescribed as directed to avoid that initial burst of pain.

## 2013-03-17 NOTE — Progress Notes (Signed)
Assisted Dr. Joslin with right, ultrasound guided, interscalene  block. Side rails up, monitors on throughout procedure. See vital signs in flow sheet. Tolerated Procedure well. 

## 2013-03-17 NOTE — Anesthesia Preprocedure Evaluation (Signed)
Anesthesia Evaluation  Patient identified by MRN, date of birth, ID band Patient awake    Reviewed: Allergy & Precautions, H&P , NPO status , Patient's Chart, lab work & pertinent test results  Airway Mallampati: II TM Distance: >3 FB Neck ROM: Full    Dental  (+) Teeth Intact, Dental Advisory Given   Pulmonary Current Smoker,  breath sounds clear to auscultation        Cardiovascular Rhythm:Regular Rate:Normal     Neuro/Psych    GI/Hepatic   Endo/Other    Renal/GU      Musculoskeletal   Abdominal (+) + obese,   Peds  Hematology   Anesthesia Other Findings   Reproductive/Obstetrics                           Anesthesia Physical Anesthesia Plan  ASA: II  Anesthesia Plan: General   Post-op Pain Management:    Induction: Intravenous  Airway Management Planned: Oral ETT  Additional Equipment:   Intra-op Plan:   Post-operative Plan: Extubation in OR  Informed Consent: I have reviewed the patients History and Physical, chart, labs and discussed the procedure including the risks, benefits and alternatives for the proposed anesthesia with the patient or authorized representative who has indicated his/her understanding and acceptance.   Dental advisory given  Plan Discussed with: CRNA and Anesthesiologist  Anesthesia Plan Comments: (Obesity Smoker Recurrent R. Shoulder dislocation  Plan GA with interscalene block  Roberts Gaudy, MD)        Anesthesia Quick Evaluation

## 2013-03-17 NOTE — Interval H&P Note (Signed)
History and Physical Interval Note:  03/17/2013 9:39 AM  Wesley Elliott  has presented today for surgery, with the diagnosis of recurrent shoulder dislocation  The various methods of treatment have been discussed with the patient and family. After consideration of risks, benefits and other options for treatment, the patient has consented to  Procedure(s): RIGHT SHOULDER ATHROSCOPY WITH CAPSULORRHAPHY WITH DEBRIDEMENT (Right) as a surgical intervention .  The patient's history has been reviewed, patient examined, no change in status, stable for surgery.  I have reviewed the patient's chart and labs.  Questions were answered to the patient's satisfaction.     [name],[name]   

## 2013-03-17 NOTE — Interval H&P Note (Signed)
History and Physical Interval Note:  03/17/2013 9:39 AM  Wesley Elliott  has presented today for surgery, with the diagnosis of recurrent shoulder dislocation  The various methods of treatment have been discussed with the patient and family. After consideration of risks, benefits and other options for treatment, the patient has consented to  Procedure(s): RIGHT SHOULDER ATHROSCOPY WITH CAPSULORRHAPHY WITH DEBRIDEMENT (Right) as a surgical intervention .  The patient's history has been reviewed, patient examined, no change in status, stable for surgery.  I have reviewed the patient's chart and labs.  Questions were answered to the patient's satisfaction.     Betheny Suchecki JR,[name]

## 2013-03-17 NOTE — Anesthesia Postprocedure Evaluation (Signed)
  Anesthesia Post-op Note  Patient: Wesley Elliott  Procedure(s) Performed: Procedure(s): RIGHT SHOULDER ACROMIOPLASTY (Right) ARTHROSCOPY RIGHT SHOULDER WITH DEBRIDEMENT (Right)  Patient Location: PACU  Anesthesia Type:GA combined with regional for post-op pain  Level of Consciousness: awake, alert  and oriented  Airway and Oxygen Therapy: Patient Spontanous Breathing and Patient connected to nasal cannula oxygen  Post-op Pain: none  Post-op Assessment: Post-op Vital signs reviewed, Patient's Cardiovascular Status Stable, Respiratory Function Stable, Patent Airway and Pain level controlled  Post-op Vital Signs: stable  Complications: No apparent anesthesia complications

## 2013-03-17 NOTE — Anesthesia Procedure Notes (Addendum)
Anesthesia Regional Block:  Interscalene brachial plexus block  Pre-Anesthetic Checklist: ,, timeout performed, Correct Patient, Correct Site, Correct Laterality, Correct Procedure, Correct Position, site marked, Risks and benefits discussed,  Surgical consent,  Pre-op evaluation,  At surgeon's request and post-op pain management  Laterality: Right  Prep: chloraprep       Needles:  Injection technique: Single-shot  Needle Type: Echogenic Stimulator Needle     Needle Length: 9cm 9 cm Needle Gauge: 22 and 22 G    Additional Needles:  Procedures: ultrasound guided (picture in chart) and nerve stimulator Interscalene brachial plexus block Narrative:  Start time: 03/17/2013 9:30 AM End time: 03/17/2013 9:35 AM Injection made incrementally with aspirations every 5 mL.  Performed by: Personally   Additional Notes: 30 cc 0.5% marcaine with 1:200 Epi and 8 mg decadron injected easily   Procedure Name: Intubation Date/Time: 03/17/2013 9:54 AM Performed by: Maryella Shivers Pre-anesthesia Checklist: Patient identified, Emergency Drugs available, Suction available and Patient being monitored Patient Re-evaluated:Patient Re-evaluated prior to inductionOxygen Delivery Method: Circle System Utilized Preoxygenation: Pre-oxygenation with 100% oxygen Intubation Type: IV induction Ventilation: Mask ventilation without difficulty Laryngoscope Size: Mac and 3 Grade View: Grade II Tube type: Oral Number of attempts: 1 Airway Equipment and Method: stylet and oral airway Placement Confirmation: ETT inserted through vocal cords under direct vision,  positive ETCO2 and breath sounds checked- equal and bilateral Secured at: 22 cm Tube secured with: Tape Dental Injury: Teeth and Oropharynx as per pre-operative assessment     Anesthesia Regional Block:  Interscalene brachial plexus block  Pre-Anesthetic Checklist: ,, timeout performed, Correct Patient, Correct Site, Correct Laterality, Correct  Procedure, Correct Position, site marked, Risks and benefits discussed,  Surgical consent,  Pre-op evaluation,  At surgeon's request and post-op pain management  Laterality: Right  Prep: chloraprep       Needles:   Needle Type: Echogenic Stimulator Needle     Needle Length: 9cm 9 cm Needle Gauge: 22 and 22 G    Additional Needles:  Procedures: ultrasound guided (picture in chart) and nerve stimulator Interscalene brachial plexus block Narrative:  Start time: 03/17/2013 9:20 AM End time: 03/17/2013 9:25 AM Injection made incrementally with aspirations every 5 mL.  Performed by: Personally   Additional Notes: 30 cc 0.5% marcaine with 1:200 Epi and 8 mg decadron injected easily  Roberts Gaudy

## 2013-03-20 ENCOUNTER — Encounter (HOSPITAL_BASED_OUTPATIENT_CLINIC_OR_DEPARTMENT_OTHER): Payer: Self-pay | Admitting: Orthopedic Surgery

## 2013-03-20 NOTE — Op Note (Signed)
Wesley Elliott, Wesley Elliott               ACCOUNT NO.:  [id number]  MEDICAL RECORD NO.:  [id number]  LOCATION:                               FACILITY:  [facility]  PHYSICIAN:  Lockie Pares, M.D.    DATE OF BIRTH:  [birth year]  DATE OF PROCEDURE:  03/17/2013 DATE OF DISCHARGE:  03/17/2013                              OPERATIVE REPORT   INDICATIONS:  Intractable right shoulder pain.  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  PREOPERATIVE DIAGNOSIS:  Anterior shoulder pain with questionable anterior-inferior instability.  POSTOPERATIVE DIAGNOSIS:  Partial subscapularis tendon tear, degenerative tear anterior and superiorly in 1, 2, and 3 impingement.  OPERATION: 1. Arthroscopic debridement intra-articular torn labrum and     subscapularis tear. 2. Arthroscopic acromioplasty of the right shoulder.  SURGEON:  Lockie Pares, M.D.  PROCEDURE IN DETAIL:  He was examined under anesthesia thoroughly. There was absolutely no instability noted.  After thorough examination, he was arthroscoped the posterolateral and anterior portal of the right shoulder __________ degenerative tear in the anterior and superior labrum.  There was some slight tendency for medial subluxation of the biceps, but not really severe.  There was interstitial tear of the subscapularis.  It was oriented in line with the longest fibers of the subscap.  This was described as a partial subscap tear.  I could not really say what the degree of certainty was producing any biceps instability, so we elected not to do anything with the biceps. Likewise, the orientation of the tear was not off the bone and not amenable to repair.  We debrided the tear as well.  Subacromial space was hypertrophied and moderately inflamed.  We performed a bursectomy and arthroscopic acromioplasty releasing the CA ligament.  Superior surface of the cuff was normal.  Shoulder drained  free of fluid __________ was closed with nylon and taken to recovery room in stable condition.     Lockie Pares, M.D.     WDC/MEDQ  D:  03/17/2013  T:  03/17/2013  Job:  [id number]

## 2013-03-29 ENCOUNTER — Ambulatory Visit: Payer: Medicare Other | Attending: Orthopedic Surgery | Admitting: Physical Therapy

## 2013-03-29 DIAGNOSIS — M25519 Pain in unspecified shoulder: Secondary | ICD-10-CM | POA: Diagnosis not present

## 2013-03-29 DIAGNOSIS — IMO0001 Reserved for inherently not codable concepts without codable children: Secondary | ICD-10-CM | POA: Insufficient documentation

## 2013-03-29 DIAGNOSIS — M25619 Stiffness of unspecified shoulder, not elsewhere classified: Secondary | ICD-10-CM | POA: Diagnosis not present

## 2013-03-29 DIAGNOSIS — R5381 Other malaise: Secondary | ICD-10-CM | POA: Insufficient documentation

## 2013-04-04 ENCOUNTER — Ambulatory Visit: Payer: Medicare Other | Admitting: Physical Therapy

## 2013-04-04 DIAGNOSIS — R5381 Other malaise: Secondary | ICD-10-CM | POA: Diagnosis not present

## 2013-04-04 DIAGNOSIS — M25519 Pain in unspecified shoulder: Secondary | ICD-10-CM | POA: Diagnosis not present

## 2013-04-04 DIAGNOSIS — M25619 Stiffness of unspecified shoulder, not elsewhere classified: Secondary | ICD-10-CM | POA: Diagnosis not present

## 2013-04-04 DIAGNOSIS — IMO0001 Reserved for inherently not codable concepts without codable children: Secondary | ICD-10-CM | POA: Diagnosis not present

## 2013-04-06 ENCOUNTER — Ambulatory Visit: Payer: Medicare Other

## 2013-04-06 DIAGNOSIS — IMO0001 Reserved for inherently not codable concepts without codable children: Secondary | ICD-10-CM | POA: Diagnosis not present

## 2013-04-06 DIAGNOSIS — M25619 Stiffness of unspecified shoulder, not elsewhere classified: Secondary | ICD-10-CM | POA: Diagnosis not present

## 2013-04-06 DIAGNOSIS — M25519 Pain in unspecified shoulder: Secondary | ICD-10-CM | POA: Diagnosis not present

## 2013-04-06 DIAGNOSIS — R5381 Other malaise: Secondary | ICD-10-CM | POA: Diagnosis not present

## 2013-04-11 ENCOUNTER — Encounter: Payer: Medicare Other | Admitting: Physical Therapy

## 2013-11-29 DIAGNOSIS — F482 Pseudobulbar affect: Secondary | ICD-10-CM | POA: Diagnosis not present

## 2014-01-03 DIAGNOSIS — F482 Pseudobulbar affect: Secondary | ICD-10-CM | POA: Diagnosis not present

## 2014-03-22 DIAGNOSIS — F482 Pseudobulbar affect: Secondary | ICD-10-CM | POA: Diagnosis not present

## 2014-06-25 DIAGNOSIS — B356 Tinea cruris: Secondary | ICD-10-CM | POA: Diagnosis not present

## 2014-06-25 DIAGNOSIS — M25512 Pain in left shoulder: Secondary | ICD-10-CM | POA: Diagnosis not present

## 2014-06-25 DIAGNOSIS — L02214 Cutaneous abscess of groin: Secondary | ICD-10-CM | POA: Diagnosis not present

## 2014-07-19 DIAGNOSIS — F482 Pseudobulbar affect: Secondary | ICD-10-CM | POA: Diagnosis not present

## 2014-11-19 DIAGNOSIS — F482 Pseudobulbar affect: Secondary | ICD-10-CM | POA: Diagnosis not present

## 2015-02-11 DIAGNOSIS — E7800 Pure hypercholesterolemia, unspecified: Secondary | ICD-10-CM | POA: Diagnosis not present

## 2015-02-11 DIAGNOSIS — Z87820 Personal history of traumatic brain injury: Secondary | ICD-10-CM | POA: Diagnosis not present

## 2015-02-11 DIAGNOSIS — F070 Personality change due to known physiological condition: Secondary | ICD-10-CM | POA: Diagnosis not present

## 2015-02-11 DIAGNOSIS — Z0000 Encounter for general adult medical examination without abnormal findings: Secondary | ICD-10-CM | POA: Diagnosis not present

## 2015-02-11 DIAGNOSIS — Z113 Encounter for screening for infections with a predominantly sexual mode of transmission: Secondary | ICD-10-CM | POA: Diagnosis not present

## 2015-02-11 DIAGNOSIS — Z1389 Encounter for screening for other disorder: Secondary | ICD-10-CM | POA: Diagnosis not present

## 2015-02-11 DIAGNOSIS — Z23 Encounter for immunization: Secondary | ICD-10-CM | POA: Diagnosis not present

## 2015-02-11 MED FILL — NUEDEXTA 20-10 MG CAPSULE: 20-10 | 30 days supply | Qty: 60 | Fill #0

## 2015-03-12 MED FILL — NUEDEXTA 20-10 MG CAPSULE: 20-10 | 30 days supply | Qty: 60 | Fill #1

## 2015-03-22 DIAGNOSIS — M1712 Unilateral primary osteoarthritis, left knee: Secondary | ICD-10-CM | POA: Diagnosis not present

## 2015-04-15 MED FILL — NUEDEXTA 20-10 MG CAPSULE: 20-10 | 30 days supply | Qty: 60 | Fill #2

## 2015-04-18 DIAGNOSIS — F482 Pseudobulbar affect: Secondary | ICD-10-CM | POA: Diagnosis not present

## 2015-05-20 MED FILL — NUEDEXTA 20-10 MG CAPSULE: 20-10 | 30 days supply | Qty: 60 | Fill #3

## 2015-06-20 MED FILL — NUEDEXTA 20-10 MG CAPSULE: 20-10 | 30 days supply | Qty: 60 | Fill #4

## 2015-06-24 DIAGNOSIS — R110 Nausea: Secondary | ICD-10-CM | POA: Diagnosis not present

## 2015-06-24 DIAGNOSIS — R1013 Epigastric pain: Secondary | ICD-10-CM | POA: Diagnosis not present

## 2015-06-25 MED FILL — SUCRALFATE 1 GM TABLET: 1 | 10 days supply | Qty: 20 | Fill #0

## 2015-06-25 MED FILL — ONDANSETRON HCL 4 MG TABLET: 4 | 5 days supply | Qty: 30 | Fill #0

## 2015-06-25 MED FILL — PANTOPRAZOLE SOD DR 40 MG T: 40 | 30 days supply | Qty: 30 | Fill #0

## 2015-07-24 DIAGNOSIS — E6609 Other obesity due to excess calories: Secondary | ICD-10-CM | POA: Diagnosis not present

## 2015-07-24 DIAGNOSIS — K219 Gastro-esophageal reflux disease without esophagitis: Secondary | ICD-10-CM | POA: Diagnosis not present

## 2015-07-24 DIAGNOSIS — R21 Rash and other nonspecific skin eruption: Secondary | ICD-10-CM | POA: Diagnosis not present

## 2015-07-24 MED FILL — PANTOPRAZOLE SOD DR 40 MG T: 40 | 30 days supply | Qty: 30 | Fill #0

## 2015-07-25 MED FILL — NUEDEXTA 20-10 MG CAPSULE: 20-10 | 30 days supply | Qty: 60 | Fill #5

## 2015-08-15 DIAGNOSIS — M79631 Pain in right forearm: Secondary | ICD-10-CM | POA: Diagnosis not present

## 2015-08-28 MED FILL — PANTOPRAZOLE SOD DR 40 MG T: 40 | 30 days supply | Qty: 30 | Fill #1

## 2015-08-28 MED FILL — NUEDEXTA 20-10 MG CAPSULE: 20-10 | 30 days supply | Qty: 60 | Fill #0

## 2015-09-16 DIAGNOSIS — F482 Pseudobulbar affect: Secondary | ICD-10-CM | POA: Diagnosis not present

## 2015-09-16 MED FILL — QUETIAPINE FUMARATE 50 MG T: 50 | 30 days supply | Qty: 60 | Fill #0

## 2015-09-30 MED FILL — NUEDEXTA 20-10 MG CAPSULE: 20-10 | 30 days supply | Qty: 60 | Fill #1

## 2015-10-09 MED FILL — PANTOPRAZOLE SOD DR 40 MG T: 40 | 30 days supply | Qty: 30 | Fill #0

## 2015-10-19 DIAGNOSIS — K920 Hematemesis: Secondary | ICD-10-CM | POA: Diagnosis not present

## 2015-10-23 DIAGNOSIS — F482 Pseudobulbar affect: Secondary | ICD-10-CM | POA: Diagnosis not present

## 2015-10-30 ENCOUNTER — Emergency Department (HOSPITAL_COMMUNITY): Payer: Medicare Other

## 2015-10-30 ENCOUNTER — Encounter (HOSPITAL_COMMUNITY): Payer: Self-pay | Admitting: Emergency Medicine

## 2015-10-30 DIAGNOSIS — Z23 Encounter for immunization: Secondary | ICD-10-CM | POA: Diagnosis not present

## 2015-10-30 DIAGNOSIS — F17210 Nicotine dependence, cigarettes, uncomplicated: Secondary | ICD-10-CM | POA: Insufficient documentation

## 2015-10-30 DIAGNOSIS — Y9389 Activity, other specified: Secondary | ICD-10-CM | POA: Insufficient documentation

## 2015-10-30 DIAGNOSIS — S61200A Unspecified open wound of right index finger without damage to nail, initial encounter: Secondary | ICD-10-CM | POA: Diagnosis not present

## 2015-10-30 DIAGNOSIS — Y929 Unspecified place or not applicable: Secondary | ICD-10-CM | POA: Diagnosis not present

## 2015-10-30 DIAGNOSIS — W290XXA Contact with powered kitchen appliance, initial encounter: Secondary | ICD-10-CM | POA: Insufficient documentation

## 2015-10-30 DIAGNOSIS — S61210A Laceration without foreign body of right index finger without damage to nail, initial encounter: Secondary | ICD-10-CM | POA: Diagnosis not present

## 2015-10-30 DIAGNOSIS — Y999 Unspecified external cause status: Secondary | ICD-10-CM | POA: Diagnosis not present

## 2015-10-30 DIAGNOSIS — S61300A Unspecified open wound of right index finger with damage to nail, initial encounter: Secondary | ICD-10-CM | POA: Insufficient documentation

## 2015-10-30 NOTE — ED Triage Notes (Addendum)
Pt states that he cut his R pointer finger on a meat slicer tonight. Bleeding controlled. Nailbed involvement. Alert and oriented.

## 2015-10-31 ENCOUNTER — Emergency Department (HOSPITAL_COMMUNITY)
Admission: EM | Admit: 2015-10-31 | Discharge: 2015-10-31 | Disposition: A | Discharge status: Home / Self Care | Payer: Medicare Other | Attending: Emergency Medicine | Admitting: Emergency Medicine

## 2015-10-31 DIAGNOSIS — S61300A Unspecified open wound of right index finger with damage to nail, initial encounter: Secondary | ICD-10-CM | POA: Diagnosis not present

## 2015-10-31 DIAGNOSIS — S61209A Unspecified open wound of unspecified finger without damage to nail, initial encounter: Secondary | ICD-10-CM

## 2015-10-31 DIAGNOSIS — S61210A Laceration without foreign body of right index finger without damage to nail, initial encounter: Secondary | ICD-10-CM | POA: Diagnosis not present

## 2015-10-31 MED ORDER — TETANUS-DIPHTH-ACELL PERTUSSIS 5-2.5-18.5 LF-MCG/0.5 IM SUSP
0.5000 mL | Freq: Once | INTRAMUSCULAR | Status: AC
  Administered 2015-10-31: 0.5 mL via INTRAMUSCULAR
  Filled 2015-10-31: qty 0.5

## 2015-10-31 MED ORDER — BUPIVACAINE HCL (PF) 0.5 % IJ SOLN
10.0000 mL | Freq: Once | INTRAMUSCULAR | Status: AC
  Administered 2015-10-31: 10 mL
  Filled 2015-10-31: qty 30

## 2015-10-31 MED FILL — NUEDEXTA 20-10 MG CAPSULE: 20-10 | 30 days supply | Qty: 60 | Fill #2
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
## 2015-10-31 NOTE — ED Notes (Signed)
Patient verbalizes understanding of discharge instructions, home care and follow up care if needed. Patient out of department at this time. 

## 2015-10-31 NOTE — Discharge Instructions (Signed)
Change dressing tomorrow morning. Do not wash for 2 days. If bleeding begins again, apply the quick clot powder and pressure dressing. Keep your wound clean and dry. Watch for any signs of infection. Follow-up with your doctor as needed. Return if appears to be infected or unable to stop bleeding.

## 2015-11-01 DIAGNOSIS — S61210D Laceration without foreign body of right index finger without damage to nail, subsequent encounter: Secondary | ICD-10-CM | POA: Diagnosis not present

## 2015-11-06 MED FILL — QUETIAPINE FUMARATE 50 MG T: 50 | 30 days supply | Qty: 60 | Fill #1

## 2015-11-06 MED FILL — PANTOPRAZOLE SOD DR 40 MG T: 40 | 30 days supply | Qty: 30 | Fill #1

## 2015-12-02 MED FILL — NUEDEXTA 20-10 MG CAPSULE: 20-10 | 30 days supply | Qty: 60 | Fill #3

## 2015-12-10 MED FILL — PANTOPRAZOLE SOD DR 40 MG T: 40 | 30 days supply | Qty: 30 | Fill #2

## 2015-12-31 MED FILL — QUETIAPINE FUMARATE 50 MG T: 50 | 30 days supply | Qty: 60 | Fill #2

## 2015-12-31 MED FILL — NUEDEXTA 20-10 MG CAPSULE: 20-10 | 30 days supply | Qty: 60 | Fill #4

## 2016-01-03 MED FILL — PANTOPRAZOLE SOD DR 40 MG T: 40 | 30 days supply | Qty: 30 | Fill #3

## 2016-01-28 MED FILL — NUEDEXTA 20-10 MG CAPSULE: 20-10 | 30 days supply | Qty: 60 | Fill #5

## 2016-02-06 MED FILL — PANTOPRAZOLE SOD DR 40 MG T: 40 | 30 days supply | Qty: 30 | Fill #4

## 2016-02-20 DIAGNOSIS — F482 Pseudobulbar affect: Secondary | ICD-10-CM | POA: Diagnosis not present

## 2016-02-20 DIAGNOSIS — F0633 Mood disorder due to known physiological condition with manic features: Secondary | ICD-10-CM | POA: Diagnosis not present

## 2016-02-20 MED FILL — QUETIAPINE FUMARATE 50 MG T: 50 | 30 days supply | Qty: 60 | Fill #0

## 2016-02-21 MED FILL — NUEDEXTA 20-10 MG CAPSULE: 20-10 | 30 days supply | Qty: 60 | Fill #0

## 2016-03-02 MED FILL — PANTOPRAZOLE SOD DR 40 MG T: 40 | 30 days supply | Qty: 30 | Fill #0

## 2016-03-04 DIAGNOSIS — M25511 Pain in right shoulder: Secondary | ICD-10-CM | POA: Diagnosis not present

## 2016-04-01 MED FILL — PANTOPRAZOLE SOD DR 40 MG T: 40 | 30 days supply | Qty: 30 | Fill #1

## 2016-04-01 MED FILL — NUEDEXTA 20-10 MG CAPSULE: 20-10 | 30 days supply | Qty: 60 | Fill #1

## 2016-04-22 DIAGNOSIS — R109 Unspecified abdominal pain: Secondary | ICD-10-CM | POA: Diagnosis not present

## 2016-04-22 DIAGNOSIS — K922 Gastrointestinal hemorrhage, unspecified: Secondary | ICD-10-CM | POA: Diagnosis not present

## 2016-04-22 DIAGNOSIS — R1012 Left upper quadrant pain: Secondary | ICD-10-CM | POA: Diagnosis not present

## 2016-04-27 DIAGNOSIS — K921 Melena: Secondary | ICD-10-CM | POA: Diagnosis not present

## 2016-04-27 DIAGNOSIS — K920 Hematemesis: Secondary | ICD-10-CM | POA: Diagnosis not present

## 2016-04-27 MED FILL — QUETIAPINE FUMARATE 50 MG T: 50 | 30 days supply | Qty: 60 | Fill #1

## 2016-04-29 MED FILL — NUEDEXTA 20-10 MG CAPSULE: 20-10 | 30 days supply | Qty: 60 | Fill #2

## 2016-04-30 MED FILL — PANTOPRAZOLE SOD DR 40 MG T: 40 | 30 days supply | Qty: 30 | Fill #0

## 2016-05-14 DIAGNOSIS — K3189 Other diseases of stomach and duodenum: Secondary | ICD-10-CM | POA: Diagnosis not present

## 2016-05-14 DIAGNOSIS — K920 Hematemesis: Secondary | ICD-10-CM | POA: Diagnosis not present

## 2016-05-14 DIAGNOSIS — K319 Disease of stomach and duodenum, unspecified: Secondary | ICD-10-CM | POA: Diagnosis not present

## 2016-05-14 DIAGNOSIS — K921 Melena: Secondary | ICD-10-CM | POA: Diagnosis not present

## 2016-05-16 ENCOUNTER — Encounter (HOSPITAL_COMMUNITY): Payer: Self-pay | Admitting: Emergency Medicine

## 2016-05-16 ENCOUNTER — Emergency Department (HOSPITAL_COMMUNITY)
Admission: EM | Admit: 2016-05-16 | Discharge: 2016-05-18 | Disposition: A | Discharge status: Home / Self Care | Payer: Medicare Other | Attending: Emergency Medicine | Admitting: Emergency Medicine

## 2016-05-16 DIAGNOSIS — Y999 Unspecified external cause status: Secondary | ICD-10-CM | POA: Diagnosis not present

## 2016-05-16 DIAGNOSIS — S60511A Abrasion of right hand, initial encounter: Secondary | ICD-10-CM | POA: Diagnosis not present

## 2016-05-16 DIAGNOSIS — W25XXXA Contact with sharp glass, initial encounter: Secondary | ICD-10-CM | POA: Insufficient documentation

## 2016-05-16 DIAGNOSIS — Y939 Activity, unspecified: Secondary | ICD-10-CM | POA: Insufficient documentation

## 2016-05-16 DIAGNOSIS — F6381 Intermittent explosive disorder: Secondary | ICD-10-CM

## 2016-05-16 DIAGNOSIS — F17210 Nicotine dependence, cigarettes, uncomplicated: Secondary | ICD-10-CM | POA: Diagnosis not present

## 2016-05-16 DIAGNOSIS — Y929 Unspecified place or not applicable: Secondary | ICD-10-CM | POA: Insufficient documentation

## 2016-05-16 DIAGNOSIS — Z79899 Other long term (current) drug therapy: Secondary | ICD-10-CM | POA: Diagnosis not present

## 2016-05-16 DIAGNOSIS — R45851 Suicidal ideations: Secondary | ICD-10-CM

## 2016-05-16 DIAGNOSIS — S61411A Laceration without foreign body of right hand, initial encounter: Secondary | ICD-10-CM | POA: Diagnosis not present

## 2016-05-16 DIAGNOSIS — R454 Irritability and anger: Secondary | ICD-10-CM | POA: Diagnosis not present

## 2016-05-16 DIAGNOSIS — F1099 Alcohol use, unspecified with unspecified alcohol-induced disorder: Secondary | ICD-10-CM | POA: Diagnosis not present

## 2016-05-16 LAB — RAPID URINE DRUG SCREEN, HOSP PERFORMED
Amphetamines: NOT DETECTED
BENZODIAZEPINES: POSITIVE — AB
Barbiturates: NOT DETECTED
COCAINE: NOT DETECTED
Opiates: NOT DETECTED
TETRAHYDROCANNABINOL: POSITIVE — AB

## 2016-05-16 LAB — COMPREHENSIVE METABOLIC PANEL
ALBUMIN: 4.3 g/dL (ref 3.5–5.0)
ALK PHOS: 67 U/L (ref 38–126)
ALT: 31 U/L (ref 17–63)
AST: 22 U/L (ref 15–41)
Anion gap: 7 (ref 5–15)
BILIRUBIN TOTAL: 0.5 mg/dL (ref 0.3–1.2)
BUN: 15 mg/dL (ref 6–20)
CO2: 25 mmol/L (ref 22–32)
Calcium: 9.4 mg/dL (ref 8.9–10.3)
Chloride: 109 mmol/L (ref 101–111)
Creatinine, Ser: 1.14 mg/dL (ref 0.61–1.24)
GFR calc Af Amer: >60 mL/min (ref >60)
GFR calc non Af Amer: >60 mL/min (ref >60)
GLUCOSE: 83 mg/dL (ref 65–99)
Potassium: 3.8 mmol/L (ref 3.5–5.1)
Sodium: 141 mmol/L (ref 135–145)
Total Protein: 7.8 g/dL (ref 6.5–8.1)

## 2016-05-16 LAB — ETHANOL: Alcohol, Ethyl (B): <5 mg/dL (ref <5)

## 2016-05-16 LAB — CBC
HEMATOCRIT: 48.8 % (ref 39.0–52.0)
HEMOGLOBIN: 17 g/dL (ref 13.0–17.0)
MCH: 31.7 pg (ref 26.0–34.0)
MCHC: 34.8 g/dL (ref 30.0–36.0)
MCV: 90.9 fL (ref 78.0–100.0)
Platelets: 244 10*3/uL (ref 150–400)
RBC: 5.37 MIL/uL (ref 4.22–5.81)
RDW: 12.6 % (ref 11.5–15.5)
WBC: 8.5 10*3/uL (ref 4.0–10.5)

## 2016-05-16 LAB — SALICYLATE LEVEL: Salicylate Lvl: <7 mg/dL (ref 2.8–30.0)

## 2016-05-16 LAB — ACETAMINOPHEN LEVEL
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
## 2016-05-16 NOTE — ED Triage Notes (Signed)
Pt presents with mother and was reporting increasing issues with rage and suicidal ideation. Pt is TBI pt an has been having increasing pain at site of TBI. Pt states that pain that is sharp. Pt family member concerned for possible aneurysm due to increased rage and pt not aware of situation at time of rage.
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
## 2016-05-16 NOTE — ED Notes (Signed)
Pt changed into purple scrubs and wanded by security. Pt mother taking wallet and cell phone along with medications home.

## 2016-05-17 ENCOUNTER — Emergency Department (HOSPITAL_COMMUNITY): Payer: Medicare Other

## 2016-05-17 DIAGNOSIS — R454 Irritability and anger: Secondary | ICD-10-CM

## 2016-05-17 DIAGNOSIS — F1099 Alcohol use, unspecified with unspecified alcohol-induced disorder: Secondary | ICD-10-CM | POA: Diagnosis not present

## 2016-05-17 DIAGNOSIS — F17210 Nicotine dependence, cigarettes, uncomplicated: Secondary | ICD-10-CM

## 2016-05-17 DIAGNOSIS — S61411A Laceration without foreign body of right hand, initial encounter: Secondary | ICD-10-CM | POA: Diagnosis not present

## 2016-05-17 MED ORDER — DEXTROMETHORPHAN-QUINIDINE 20-10 MG PO CAPS
1.0000 | ORAL_CAPSULE | Freq: Two times a day (BID) | ORAL | Status: DC
  Administered 2016-05-17: 21:00:00 via ORAL
  Administered 2016-05-17 – 2016-05-18 (×2): 1 via ORAL
  Filled 2016-05-17 (×5): qty 1

## 2016-05-17 MED ORDER — QUETIAPINE FUMARATE 50 MG PO TABS
50.0000 mg | ORAL_TABLET | Freq: Every evening | ORAL | Status: DC | PRN
  Administered 2016-05-17: 100 mg via ORAL
  Filled 2016-05-17: qty 2

## 2016-05-17 MED ORDER — CLONAZEPAM 0.5 MG PO TABS
0.5000 mg | ORAL_TABLET | Freq: Once | ORAL | Status: AC
  Administered 2016-05-17: 0.5 mg via ORAL
  Filled 2016-05-17: qty 1

## 2016-05-17 MED ORDER — ONDANSETRON HCL 4 MG PO TABS: 4.0000 mg | ORAL_TABLET | Freq: Three times a day (TID) | ORAL | Status: DC | PRN

## 2016-05-17 MED ORDER — ACETAMINOPHEN 325 MG PO TABS: 650.0000 mg | ORAL_TABLET | ORAL | Status: DC | PRN

## 2016-05-17 MED ORDER — IBUPROFEN 200 MG PO TABS: 600.0000 mg | ORAL_TABLET | Freq: Three times a day (TID) | ORAL | Status: DC | PRN

## 2016-05-17 MED ORDER — NICOTINE 21 MG/24HR TD PT24
21.0000 mg | MEDICATED_PATCH | Freq: Every day | TRANSDERMAL | Status: DC
  Administered 2016-05-17: 21 mg via TRANSDERMAL
  Filled 2016-05-17: qty 1

## 2016-05-17 MED ORDER — PANTOPRAZOLE SODIUM 40 MG PO TBEC
40.0000 mg | DELAYED_RELEASE_TABLET | ORAL | Status: DC
  Administered 2016-05-17 – 2016-05-18 (×2): 40 mg via ORAL
  Filled 2016-05-17 (×2): qty 1

## 2016-05-17 MED ORDER — ZOLPIDEM TARTRATE 5 MG PO TABS
5.0000 mg | ORAL_TABLET | Freq: Every evening | ORAL | Status: DC | PRN
  Administered 2016-05-17 (×2): 5 mg via ORAL
  Filled 2016-05-17 (×2): qty 1

## 2016-05-17 NOTE — ED Notes (Signed)
0700 Protonix delayed due to patient sleeping.

## 2016-05-17 NOTE — ED Notes (Signed)
Patient resting in bed with eyes closed. No signs of distress noted currently. Respirations regular and unlabored.
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
## 2016-05-17 NOTE — ED Notes (Signed)
Bed: LT90 Expected date:  Expected time:  Means of arrival:  Comments: RM 12
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
## 2016-05-17 NOTE — ED Notes (Signed)
TTS consult in process at bedside at this time. 

## 2016-05-18 DIAGNOSIS — F6381 Intermittent explosive disorder: Secondary | ICD-10-CM | POA: Diagnosis present

## 2016-05-18 DIAGNOSIS — F17210 Nicotine dependence, cigarettes, uncomplicated: Secondary | ICD-10-CM | POA: Diagnosis not present

## 2016-05-18 MED ORDER — CARBAMAZEPINE 200 MG PO TABS: 200.0000 mg | ORAL_TABLET | Freq: Two times a day (BID) | ORAL | Status: DC

## 2016-05-18 MED ORDER — QUETIAPINE FUMARATE 200 MG PO TABS: 200.0000 mg | ORAL_TABLET | Freq: Every day | ORAL | 0 refills | Status: AC

## 2016-05-18 MED ORDER — CARBAMAZEPINE 200 MG PO TABS: 200.0000 mg | ORAL_TABLET | Freq: Two times a day (BID) | ORAL | 0 refills | Status: AC

## 2016-05-18 MED ORDER — QUETIAPINE FUMARATE 100 MG PO TABS: 100.0000 mg | ORAL_TABLET | Freq: Every day | ORAL | Status: DC

## 2016-05-18 MED ORDER — QUETIAPINE FUMARATE 100 MG PO TABS: 200.0000 mg | ORAL_TABLET | Freq: Every day | ORAL | Status: DC

## 2016-05-18 MED FILL — QUETIAPINE FUMARATE 200 MG: 200 | 30 days supply | Qty: 30 | Fill #0

## 2016-05-18 MED FILL — carBAMazepine 200 MG TABS: 200 | 30 days supply | Qty: 60 | Fill #0

## 2016-05-18 NOTE — ED Notes (Signed)
Patient to be DC per MD

## 2016-05-18 NOTE — Discharge Instructions (Signed)
For your ongoing behavioral health needs, you are advised to continue treatment with Eino Farber PA-C at [place]:       [place] [place] [facility]      [address], [address]      [place], [place] [address]      [phone]
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
## 2016-05-18 NOTE — [facility] Suicide Risk Assessment (Signed)
Suicide Risk Assessment  Discharge Assessment   [facility] Discharge Suicide Risk Assessment   Principal Problem: Intermittent explosive disorder Discharge Diagnoses:  Patient Active Problem List   Diagnosis Date Noted  . Intermittent explosive disorder [F63.81] 05/18/2016    Total Time spent with patient: 45 minutes   Musculoskeletal: Strength & Muscle Tone: within normal limits Gait & Station: normal Patient leans: N/A  Psychiatric Specialty Exam: Physical Exam  Constitutional: He is oriented to person, place, and time. He appears well-developed and well-nourished.  HENT:  Head: Normocephalic.  Neck: Normal range of motion.  Respiratory: Effort normal.  Musculoskeletal: Normal range of motion.  Neurological: He is alert and oriented to person, place, and time.  Psychiatric: He has a normal mood and affect. His speech is normal and behavior is normal. Judgment and thought content normal. Cognition and memory are normal.    Review of Systems  All other systems reviewed and are negative.   Blood pressure 114/80, pulse 62, temperature 97.8 F (36.6 C), temperature source Oral, resp. rate 16, height 5\' 10"  (1.778 m), weight 113.4 kg (250 lb), SpO2 96 %.Body mass index is 35.87 kg/m.  General Appearance: Casual  Eye Contact:  Good  Speech:  Normal Rate  Volume:  Normal  Mood:  Euthymic  Affect:  Congruent  Thought Process:  Coherent and Descriptions of Associations: Intact  Orientation:  Full (Time, Place, and Person)  Thought Content:  WDL and Logical  Suicidal Thoughts:  No  Homicidal Thoughts:  No  Memory:  Immediate;   Good Recent;   Good Remote;   Good  Judgement:  Good  Insight:  Good  Psychomotor Activity:  Normal  Concentration:  Concentration: Good and Attention Span: Good  Recall:  Good  Fund of Knowledge:  Fair  Language:  Good  Akathisia:  No  Handed:  Right  AIMS (if indicated):     Assets:  Leisure Time Physical Health Resilience Social Support   ADL's:  Intact  Cognition:  WNL  Sleep:       Mental Status Per Nursing Assessment::   On Admission:     Demographic Factors:  Male and Caucasian  Loss Factors: NA  Historical Factors: NA  Risk Reduction Factors:   Sense of responsibility to family, Living with another person, especially a relative, Positive social support and Positive therapeutic relationship  Continued Clinical Symptoms:  None  Cognitive Features That Contribute To Risk:  None    Suicide Risk:  Minimal: No identifiable suicidal ideation.  Patients presenting with no risk factors but with morbid ruminations; may be classified as minimal risk based on the severity of the depressive symptoms    Plan Of Care/Follow-up recommendations:  Activity:  as tolerated Diet:  heart healthy diet  Reinhard Schack, NP 05/18/2016, 1:02 PM

## 2016-05-18 NOTE — BH Assessment (Signed)
[place] Assessment Progress Note  Per Corena Pilgrim, MD, this pt does not require psychiatric hospitalization at this time.  Pt is to be discharged from [facility] with recommendation to follow up with Eino Farber, PA-C at [facility], pt's outpatient provider.  This has been included in pt's discharge instructions.  Pt's nurse has been notified.  Jalene Mullet, [place] Triage Specialist [phone]

## 2016-05-19 DIAGNOSIS — K319 Disease of stomach and duodenum, unspecified: Secondary | ICD-10-CM | POA: Diagnosis not present

## 2016-06-02 MED FILL — NUEDEXTA 20-10 MG CAPSULE: 20-10 | 30 days supply | Qty: 60 | Fill #3

## 2016-06-02 MED FILL — PANTOPRAZOLE SOD DR 40 MG T: 40 | 30 days supply | Qty: 30 | Fill #0

## 2016-06-04 DIAGNOSIS — F0633 Mood disorder due to known physiological condition with manic features: Secondary | ICD-10-CM | POA: Diagnosis not present

## 2016-06-04 DIAGNOSIS — F482 Pseudobulbar affect: Secondary | ICD-10-CM | POA: Diagnosis not present

## 2016-06-17 MED FILL — carBAMazepine 200 MG TABS: 200 | 30 days supply | Qty: 60 | Fill #0

## 2016-06-17 MED FILL — QUETIAPINE FUMARATE 200 MG: 200 | 30 days supply | Qty: 30 | Fill #0

## 2016-07-02 MED FILL — PANTOPRAZOLE SOD DR 40 MG T: 40 | 15 days supply | Qty: 15 | Fill #0

## 2016-07-02 MED FILL — NUEDEXTA 20-10 MG CAPSULE: 20-10 | 30 days supply | Qty: 60 | Fill #4

## 2016-07-11 MED FILL — QUETIAPINE FUMARATE 200 MG: 200 | 30 days supply | Qty: 30 | Fill #1

## 2016-07-11 MED FILL — carBAMazepine 200 MG TABS: 200 | 30 days supply | Qty: 60 | Fill #1

## 2016-07-27 MED FILL — NUEDEXTA 20-10 MG CAPSULE: 20-10 | 30 days supply | Qty: 60 | Fill #5

## 2016-07-28 MED FILL — PANTOPRAZOLE SOD DR 40 MG T: 40 | 30 days supply | Qty: 30 | Fill #0

## 2016-08-18 DIAGNOSIS — F482 Pseudobulbar affect: Secondary | ICD-10-CM | POA: Diagnosis not present

## 2016-08-18 DIAGNOSIS — F0633 Mood disorder due to known physiological condition with manic features: Secondary | ICD-10-CM | POA: Diagnosis not present

## 2016-08-18 MED FILL — QUETIAPINE FUMARATE 200 MG: 200 | 30 days supply | Qty: 30 | Fill #0

## 2016-08-28 DIAGNOSIS — K2920 Alcoholic gastritis without bleeding: Secondary | ICD-10-CM | POA: Diagnosis not present

## 2016-08-28 DIAGNOSIS — K219 Gastro-esophageal reflux disease without esophagitis: Secondary | ICD-10-CM | POA: Diagnosis not present

## 2016-08-28 MED FILL — SUCRALFATE 1 GM TABLET: 1 | 7 days supply | Qty: 14 | Fill #0

## 2016-08-28 MED FILL — ONDANSETRON ODT 4 MG TABLET: 4 | 5 days supply | Qty: 28 | Fill #0

## 2016-08-28 MED FILL — PANTOPRAZOLE SOD DR 40 MG T: 40 | 90 days supply | Qty: 90 | Fill #0

## 2016-09-01 MED FILL — NUEDEXTA 20-10 MG CAPSULE: 20-10 | 30 days supply | Qty: 60 | Fill #0

## 2016-09-21 MED FILL — QUETIAPINE FUMARATE 200 MG: 200 | 30 days supply | Qty: 30 | Fill #1

## 2016-10-01 MED FILL — NUEDEXTA 20-10 MG CAPSULE: 20-10 | 30 days supply | Qty: 60 | Fill #0

## 2016-10-19 DIAGNOSIS — F482 Pseudobulbar affect: Secondary | ICD-10-CM | POA: Diagnosis not present

## 2016-10-19 DIAGNOSIS — F0633 Mood disorder due to known physiological condition with manic features: Secondary | ICD-10-CM | POA: Diagnosis not present

## 2016-10-19 MED FILL — QUETIAPINE FUMARATE 200 MG: 200 | 30 days supply | Qty: 30 | Fill #0

## 2016-10-28 MED FILL — NUEDEXTA 20-10 MG CAPSULE: 20-10 | 30 days supply | Qty: 60 | Fill #1

## 2016-11-19 MED FILL — PANTOPRAZOLE SOD DR 40 MG T: 40 | 90 days supply | Qty: 90 | Fill #1

## 2016-11-20 MED FILL — QUETIAPINE FUMARATE 200 MG: 200 | 30 days supply | Qty: 30 | Fill #2

## 2016-12-01 MED FILL — NUEDEXTA 20-10 MG CAPSULE: 20-10 | 30 days supply | Qty: 60 | Fill #2

## 2016-12-25 MED FILL — QUETIAPINE FUMARATE 200 MG: 200 | 30 days supply | Qty: 30 | Fill #1

## 2017-01-01 MED FILL — NUEDEXTA 20-10 MG CAPSULE: 20-10 | 30 days supply | Qty: 60 | Fill #0

## 2017-01-25 MED FILL — QUETIAPINE FUMARATE 200 MG: 200 | 30 days supply | Qty: 30 | Fill #2

## 2017-02-02 MED FILL — NUEDEXTA 20-10 MG CAPSULE: 20-10 | 30 days supply | Qty: 60 | Fill #1

## 2017-03-01 MED FILL — QUETIAPINE FUMARATE 200 MG: 200 | 30 days supply | Qty: 30 | Fill #3

## 2017-03-01 MED FILL — PANTOPRAZOLE SOD DR 40 MG T: 40 | 90 days supply | Qty: 90 | Fill #0

## 2017-03-01 MED FILL — NUEDEXTA 20-10 MG CAPSULE: 20-10 | 30 days supply | Qty: 60 | Fill #0

## 2017-03-31 MED FILL — QUETIAPINE FUMARATE 200 MG: 200 | 30 days supply | Qty: 30 | Fill #0

## 2017-03-31 MED FILL — NUEDEXTA 20-10 MG CAPSULE: 20-10 | 30 days supply | Qty: 60 | Fill #1

## 2017-04-05 MED FILL — CARBAMAZEPINE XR 200 MG TAB: 200 | 30 days supply | Qty: 60 | Fill #0

## 2017-04-29 MED FILL — QUETIAPINE FUMARATE 200 MG: 200 | 30 days supply | Qty: 30 | Fill #1

## 2017-04-29 MED FILL — NUEDEXTA 20-10 MG CAPSULE: 20-10 | 30 days supply | Qty: 60 | Fill #2

## 2017-05-06 MED FILL — CARBAMAZEPINE XR 200 MG TAB: 200 | 30 days supply | Qty: 60 | Fill #1

## 2017-05-27 MED FILL — QUETIAPINE FUMARATE 200 MG: 200 | 30 days supply | Qty: 30 | Fill #0

## 2017-05-27 MED FILL — NUEDEXTA 20-10 MG CAPSULE: 20-10 | 30 days supply | Qty: 60 | Fill #3

## 2017-05-28 MED FILL — PANTOPRAZOLE SOD DR 40 MG T: 40 | 90 days supply | Qty: 90 | Fill #1

## 2017-06-02 DIAGNOSIS — F0633 Mood disorder due to known physiological condition with manic features: Secondary | ICD-10-CM | POA: Diagnosis not present

## 2017-06-04 MED FILL — CARBAMAZEPINE XR 200 MG TAB: 200 | 30 days supply | Qty: 60 | Fill #0

## 2017-06-24 MED FILL — QUETIAPINE FUMARATE 200 MG: 200 | 30 days supply | Qty: 30 | Fill #1

## 2017-07-01 MED FILL — NUEDEXTA 20-10 MG CAPSULE: 20-10 | 30 days supply | Qty: 60 | Fill #0

## 2017-07-01 MED FILL — CARBAMAZEPINE XR 200 MG TAB: 200 | 30 days supply | Qty: 60 | Fill #1

## 2017-07-21 MED FILL — QUETIAPINE FUMARATE 200 MG: 200 | 30 days supply | Qty: 30 | Fill #2

## 2017-08-03 MED FILL — NUEDEXTA 20-10 MG CAPSULE: 20-10 | 30 days supply | Qty: 60 | Fill #1

## 2017-08-03 MED FILL — CARBAMAZEPINE XR 200 MG TAB: 200 | 30 days supply | Qty: 60 | Fill #2

## 2017-08-20 MED FILL — PANTOPRAZOLE SOD DR 40 MG T: 40 | 90 days supply | Qty: 90 | Fill #0

## 2017-08-20 MED FILL — QUETIAPINE FUMARATE 200 MG: 200 | 30 days supply | Qty: 30 | Fill #3

## 2017-08-25 DIAGNOSIS — L309 Dermatitis, unspecified: Secondary | ICD-10-CM | POA: Diagnosis not present

## 2017-08-25 MED FILL — CLOTRIMAZOLE-BETAMETHASONE: 1-0.05 | 30 days supply | Qty: 45 | Fill #0

## 2017-08-31 MED FILL — NUEDEXTA 20-10 MG CAPSULE: 20-10 | 30 days supply | Qty: 60 | Fill #2

## 2017-09-03 MED FILL — CARBAMAZEPINE XR 200 MG TAB: 200 | 30 days supply | Qty: 60 | Fill #0

## 2017-09-04 IMAGING — CR DG HAND COMPLETE 3+V*R*
3 series · 3 of 3 positions shown · non-contrast
Comparison: None.

CLINICAL DATA: Multiple lacerations to the right hand without pain
after punching a picture breaking glass.

EXAM:
RIGHT HAND - COMPLETE 3+ VIEW

[x hand pa right]
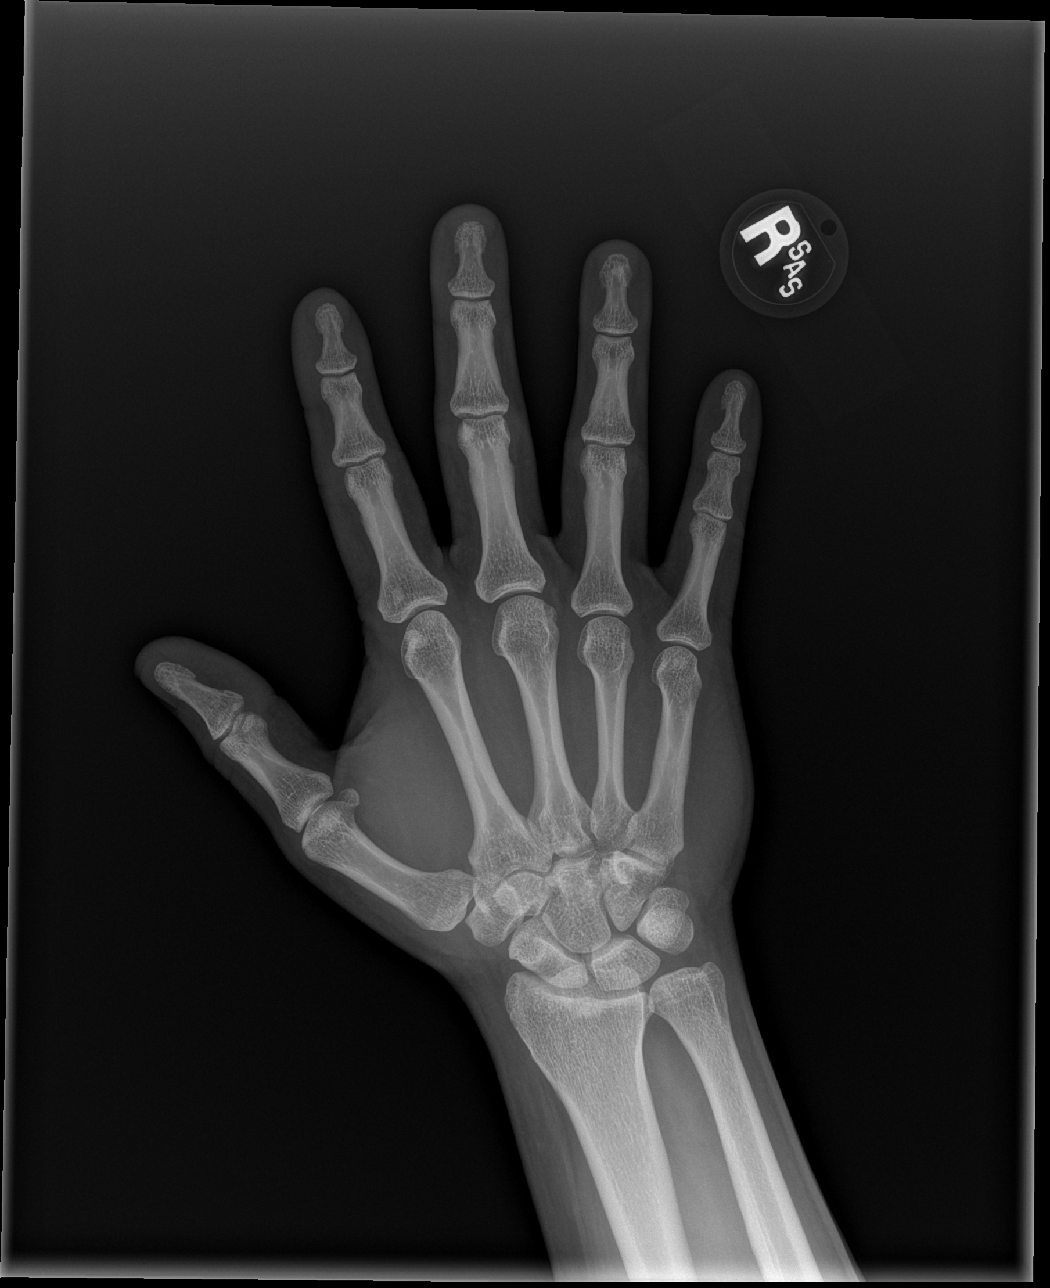

[x hand obl right]
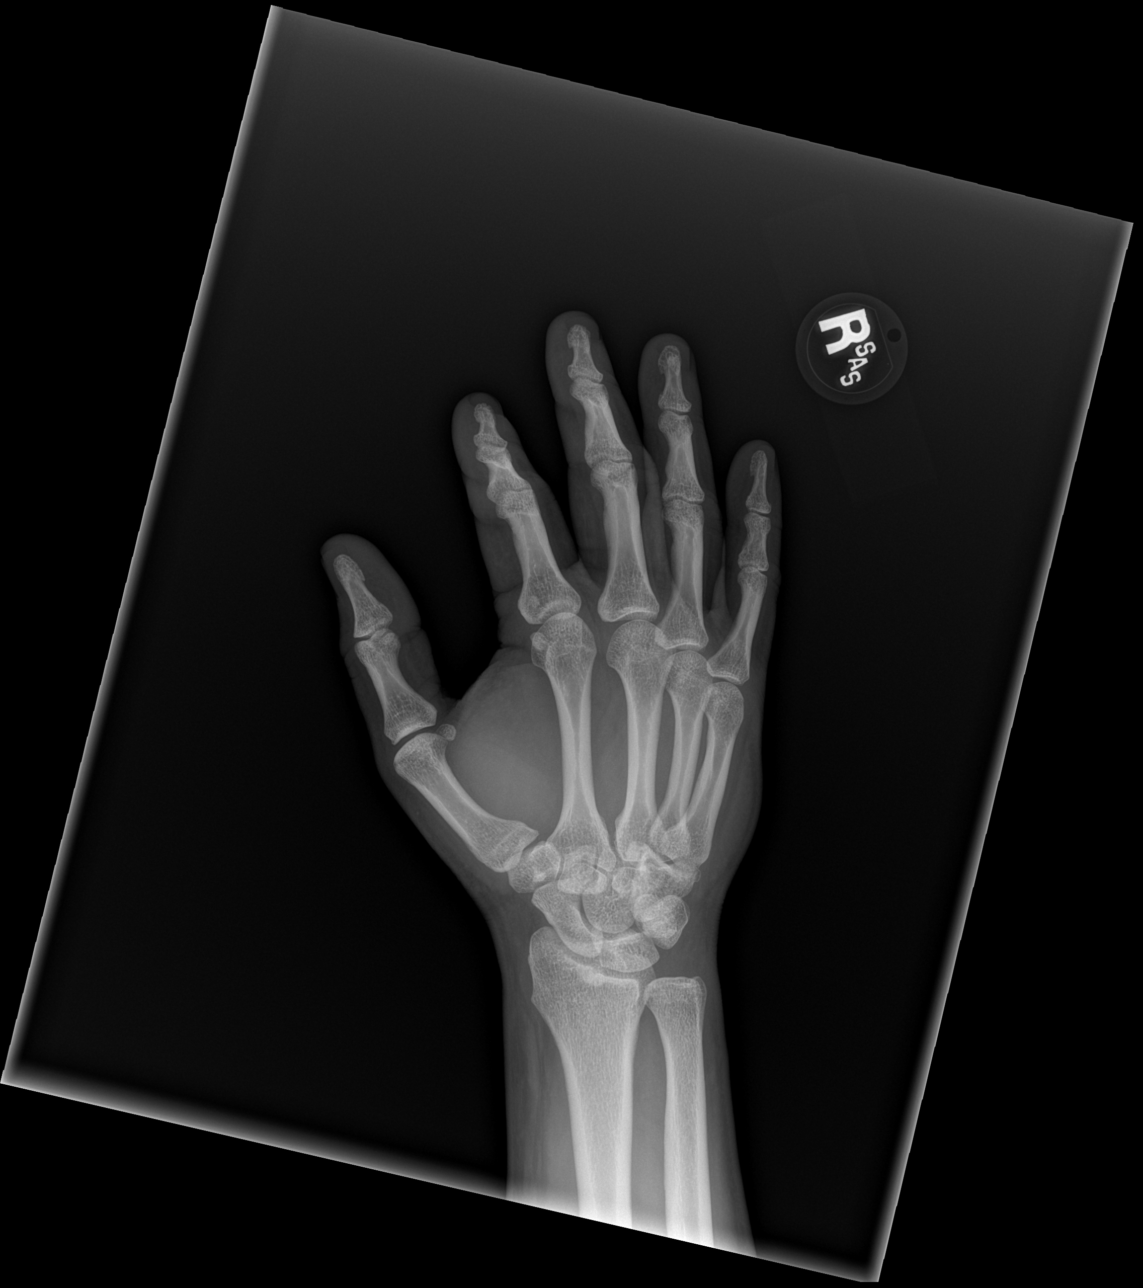

[x hand lat right]
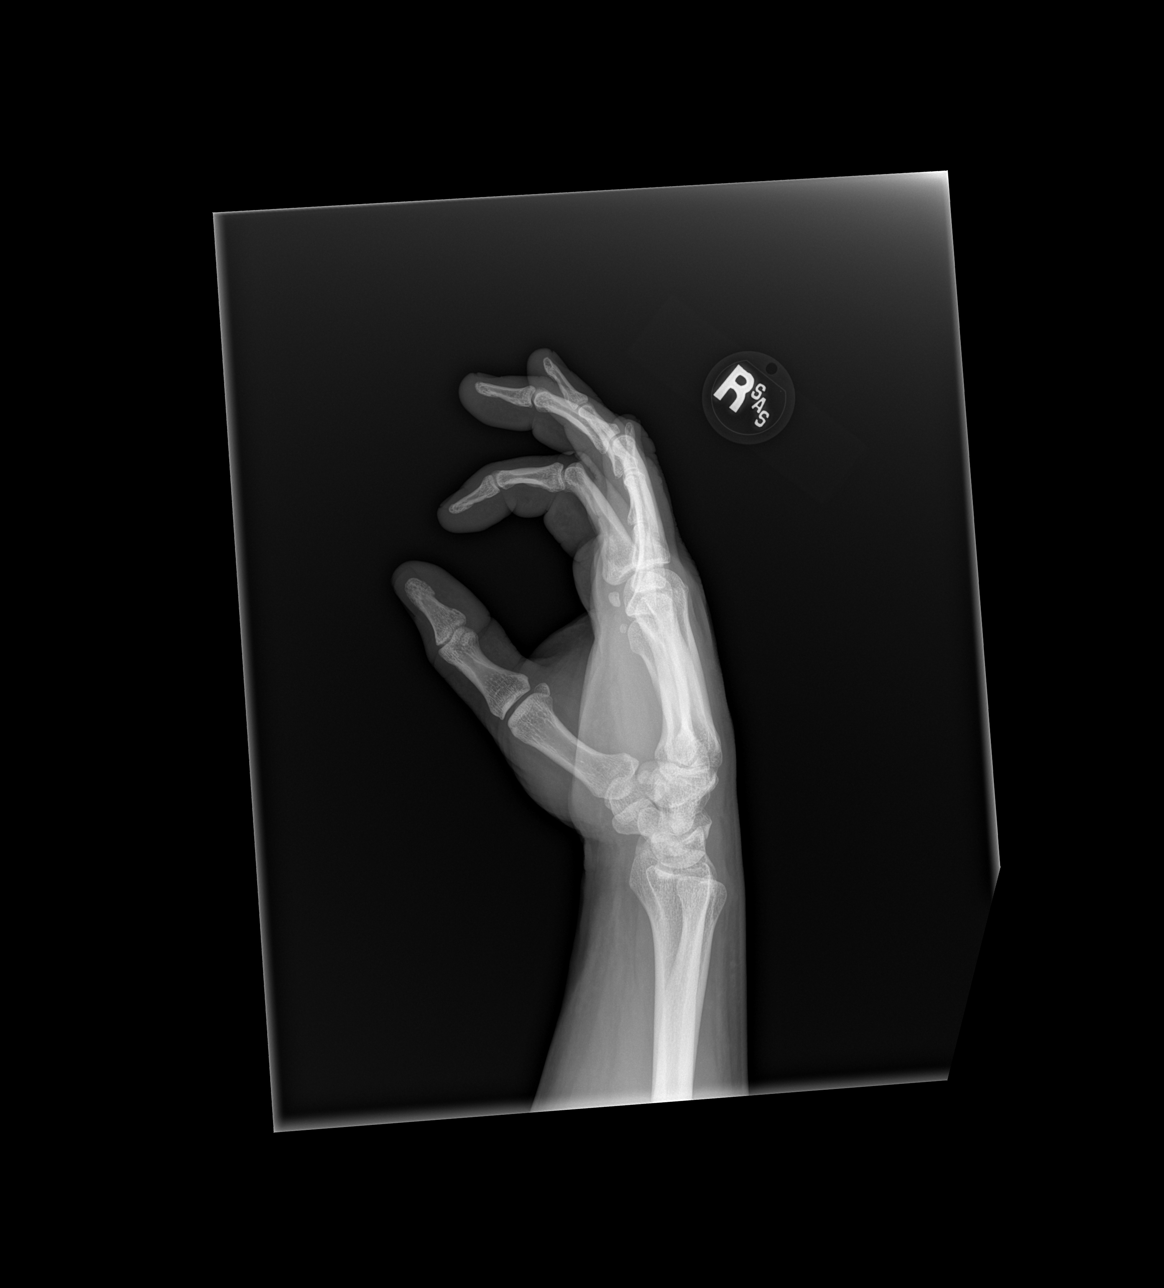

[3 of 3 positions shown; findings below may reference images not displayed]

FINDINGS: There is no evidence of fracture or dislocation. There is no
evidence of arthropathy or other focal bone abnormality. No
radiopaque foreign body is identified.
IMPRESSION: Negative for acute fracture or malalignment. No radiopaque foreign
body is identified within the soft tissues.

## 2017-09-08 DIAGNOSIS — R1013 Epigastric pain: Secondary | ICD-10-CM | POA: Diagnosis not present

## 2017-09-09 ENCOUNTER — Other Ambulatory Visit: Payer: Self-pay | Admitting: Family Medicine

## 2017-09-09 DIAGNOSIS — R1013 Epigastric pain: Secondary | ICD-10-CM

## 2017-09-21 ENCOUNTER — Ambulatory Visit
Admission: RE | Admit: 2017-09-21 | Discharge: 2017-09-21 | Disposition: A | Discharge status: Home / Self Care | Payer: Medicare Other | Source: Ambulatory Visit | Attending: Family Medicine | Admitting: Family Medicine

## 2017-09-21 DIAGNOSIS — K760 Fatty (change of) liver, not elsewhere classified: Secondary | ICD-10-CM | POA: Diagnosis not present

## 2017-09-21 DIAGNOSIS — R1013 Epigastric pain: Secondary | ICD-10-CM

## 2017-09-22 DIAGNOSIS — S43004A Unspecified dislocation of right shoulder joint, initial encounter: Secondary | ICD-10-CM | POA: Diagnosis not present

## 2017-09-23 MED FILL — CLOTRIMAZOLE-BETAMETHASONE: 1-0.05 | 30 days supply | Qty: 45 | Fill #1

## 2017-09-27 MED FILL — QUETIAPINE FUMARATE 200 MG: 200 | 30 days supply | Qty: 30 | Fill #0

## 2017-09-29 MED FILL — NUEDEXTA 20-10 MG CAPSULE: 20-10 | 30 days supply | Qty: 60 | Fill #3

## 2017-10-01 MED FILL — CARBAMAZEPINE XR 200 MG TAB: 200 | 30 days supply | Qty: 60 | Fill #1

## 2017-10-05 DIAGNOSIS — S43004A Unspecified dislocation of right shoulder joint, initial encounter: Secondary | ICD-10-CM | POA: Diagnosis not present

## 2017-10-11 DIAGNOSIS — M67911 Unspecified disorder of synovium and tendon, right shoulder: Secondary | ICD-10-CM | POA: Diagnosis not present

## 2017-10-11 DIAGNOSIS — M25511 Pain in right shoulder: Secondary | ICD-10-CM | POA: Diagnosis not present

## 2017-10-13 MED FILL — PANTOPRAZOLE SOD DR 40 MG T: 40 | 90 days supply | Qty: 180 | Fill #0

## 2017-10-18 DIAGNOSIS — M25519 Pain in unspecified shoulder: Secondary | ICD-10-CM | POA: Diagnosis not present

## 2017-10-25 MED FILL — QUETIAPINE FUMARATE 200 MG: 200 | 30 days supply | Qty: 30 | Fill #1

## 2017-10-28 MED FILL — NUEDEXTA 20-10 MG CAPSULE: 20-10 | 30 days supply | Qty: 60 | Fill #0

## 2017-11-01 MED FILL — CARBAMAZEPINE XR 200 MG TAB: 200 | 30 days supply | Qty: 60 | Fill #0

## 2017-11-03 DIAGNOSIS — M25511 Pain in right shoulder: Secondary | ICD-10-CM | POA: Diagnosis not present

## 2017-11-11 DIAGNOSIS — M25511 Pain in right shoulder: Secondary | ICD-10-CM | POA: Diagnosis not present

## 2017-11-15 DIAGNOSIS — M25511 Pain in right shoulder: Secondary | ICD-10-CM | POA: Diagnosis not present

## 2017-11-18 MED FILL — AMOXICILLIN 500 MG CAPSULE: 500 | 7 days supply | Qty: 28 | Fill #0

## 2017-11-18 MED FILL — IBUPROFEN 800 MG TAB: 800 | 7 days supply | Qty: 20 | Fill #0

## 2017-11-24 MED FILL — QUETIAPINE FUMARATE 200 MG: 200 | 30 days supply | Qty: 30 | Fill #2

## 2017-11-29 DIAGNOSIS — M67911 Unspecified disorder of synovium and tendon, right shoulder: Secondary | ICD-10-CM | POA: Diagnosis not present

## 2017-12-01 MED FILL — NUEDEXTA 20-10 MG CAPSULE: 20-10 | 30 days supply | Qty: 60 | Fill #1

## 2017-12-01 MED FILL — CARBAMAZEPINE XR 200 MG TAB: 200 | 30 days supply | Qty: 60 | Fill #1

## 2017-12-23 DIAGNOSIS — I10 Essential (primary) hypertension: Secondary | ICD-10-CM | POA: Diagnosis not present

## 2017-12-23 DIAGNOSIS — K0889 Other specified disorders of teeth and supporting structures: Secondary | ICD-10-CM | POA: Diagnosis not present

## 2017-12-23 MED FILL — QUETIAPINE FUMARATE 200 MG: 200 | 30 days supply | Qty: 30 | Fill #3

## 2017-12-23 MED FILL — LOSARTAN POTASSIUM 25 MG TA: 25 | 30 days supply | Qty: 30 | Fill #0

## 2017-12-28 MED FILL — NUEDEXTA 20-10 MG CAPSULE: 20-10 | 30 days supply | Qty: 60 | Fill #2

## 2017-12-28 MED FILL — CARBAMAZEPINE XR 200 MG TAB: 200 | 30 days supply | Qty: 60 | Fill #2

## 2018-01-07 MED FILL — PANTOPRAZOLE SOD DR 40 MG T: 40 | 90 days supply | Qty: 180 | Fill #1

## 2018-01-20 MED FILL — QUETIAPINE FUMARATE 200 MG: 200 | 30 days supply | Qty: 30 | Fill #0

## 2018-01-20 MED FILL — LOSARTAN POTASSIUM 25 MG TA: 25 | 30 days supply | Qty: 30 | Fill #1

## 2018-01-27 MED FILL — CARBAMAZEPINE XR 200 MG TAB: 200 | 30 days supply | Qty: 60 | Fill #3

## 2018-01-28 MED FILL — NUEDEXTA 20-10 MG CAPSULE: 20-10 | 30 days supply | Qty: 60 | Fill #3

## 2018-02-09 MED FILL — AMOXICILLIN 500 MG CAPSULE: 500 | 7 days supply | Qty: 28 | Fill #0

## 2018-02-18 MED FILL — LOSARTAN POTASSIUM 25 MG TA: 25 | 30 days supply | Qty: 30 | Fill #0

## 2018-02-22 MED FILL — QUETIAPINE FUMARATE 200 MG: 200 | 30 days supply | Qty: 30 | Fill #0

## 2018-02-25 DIAGNOSIS — K219 Gastro-esophageal reflux disease without esophagitis: Secondary | ICD-10-CM | POA: Diagnosis not present

## 2018-02-25 DIAGNOSIS — I10 Essential (primary) hypertension: Secondary | ICD-10-CM | POA: Diagnosis not present

## 2018-02-25 DIAGNOSIS — S39011A Strain of muscle, fascia and tendon of abdomen, initial encounter: Secondary | ICD-10-CM | POA: Diagnosis not present

## 2018-02-25 DIAGNOSIS — F17210 Nicotine dependence, cigarettes, uncomplicated: Secondary | ICD-10-CM | POA: Diagnosis not present

## 2018-02-25 MED FILL — MELOXICAM 15 MG TABLET: 15 | 7 days supply | Qty: 7 | Fill #0

## 2018-03-01 MED FILL — NUEDEXTA 20-10 MG CAPSULE: 20-10 | 30 days supply | Qty: 60 | Fill #0

## 2018-03-01 MED FILL — CARBAMAZEPINE XR 200 MG TAB: 200 | 30 days supply | Qty: 60 | Fill #0

## 2018-03-02 MED FILL — LOSARTAN POTASSIUM 25 MG TA: 25 | 30 days supply | Qty: 60 | Fill #0

## 2018-03-15 MED FILL — QUETIAPINE 300 MG TABLET: 300 | 30 days supply | Qty: 30 | Fill #0

## 2018-03-28 MED FILL — CARBAMAZEPINE XR 200 MG TAB: 200 | 30 days supply | Qty: 60 | Fill #1

## 2018-03-28 MED FILL — NUEDEXTA 20-10 MG CAPSULE: 20-10 | 30 days supply | Qty: 60 | Fill #1

## 2018-04-04 MED FILL — LOSARTAN POTASSIUM 25 MG TA: 25 | 30 days supply | Qty: 60 | Fill #1

## 2018-04-04 MED FILL — PANTOPRAZOLE SOD DR 40 MG T: 40 | 90 days supply | Qty: 180 | Fill #0

## 2018-04-11 MED FILL — QUETIAPINE 300 MG TABLET: 300 | 30 days supply | Qty: 30 | Fill #1

## 2018-05-02 MED FILL — CARBAMAZEPINE XR 200 MG TAB: 200 | 30 days supply | Qty: 60 | Fill #0

## 2018-05-02 MED FILL — LOSARTAN POTASSIUM 25 MG TA: 25 | 30 days supply | Qty: 60 | Fill #2

## 2018-05-02 MED FILL — NUEDEXTA 20-10 MG CAPSULE: 20-10 | 30 days supply | Qty: 60 | Fill #0

## 2018-05-05 MED FILL — QUETIAPINE 300 MG TABLET: 300 | 30 days supply | Qty: 30 | Fill #0

## 2018-05-27 MED FILL — NUEDEXTA 20-10 MG CAPSULE: 20-10 | 30 days supply | Qty: 60 | Fill #1

## 2018-05-31 MED FILL — QUETIAPINE 300 MG TABLET: 300 | 30 days supply | Qty: 30 | Fill #1

## 2018-05-31 MED FILL — CARBAMAZEPINE XR 200 MG TAB: 200 | 30 days supply | Qty: 60 | Fill #1

## 2018-05-31 MED FILL — LOSARTAN POTASSIUM 25 MG TA: 25 | 30 days supply | Qty: 60 | Fill #3

## 2018-06-08 DIAGNOSIS — M25562 Pain in left knee: Secondary | ICD-10-CM | POA: Diagnosis not present

## 2018-06-08 MED FILL — CEPHALEXIN 500 MG CAPSULE: 500 | 7 days supply | Qty: 21 | Fill #0

## 2018-06-24 MED FILL — NUEDEXTA 20-10 MG CAPSULE: 20-10 | 30 days supply | Qty: 60 | Fill #2

## 2018-06-28 MED FILL — LOSARTAN POTASSIUM 25 MG TA: 25 | 30 days supply | Qty: 60 | Fill #4

## 2018-06-28 MED FILL — QUETIAPINE 300 MG TABLET: 300 | 30 days supply | Qty: 30 | Fill #2

## 2018-06-28 MED FILL — PANTOPRAZOLE SOD DR 40 MG T: 40 | 90 days supply | Qty: 180 | Fill #1

## 2018-06-28 MED FILL — CARBAMAZEPINE XR 200 MG TAB: 200 | 30 days supply | Qty: 60 | Fill #2

## 2018-07-07 DIAGNOSIS — K920 Hematemesis: Secondary | ICD-10-CM | POA: Diagnosis not present

## 2018-07-07 DIAGNOSIS — I10 Essential (primary) hypertension: Secondary | ICD-10-CM | POA: Diagnosis not present

## 2018-07-07 MED FILL — SUCRALFATE 1 GM TABLET: 1 | 30 days supply | Qty: 120 | Fill #0

## 2018-07-26 DIAGNOSIS — R1084 Generalized abdominal pain: Secondary | ICD-10-CM | POA: Diagnosis not present

## 2018-07-26 DIAGNOSIS — R112 Nausea with vomiting, unspecified: Secondary | ICD-10-CM | POA: Diagnosis not present

## 2018-07-26 DIAGNOSIS — R197 Diarrhea, unspecified: Secondary | ICD-10-CM | POA: Diagnosis not present

## 2018-07-26 MED FILL — NUEDEXTA 20-10 MG CAPSULE: 20-10 | 30 days supply | Qty: 60 | Fill #0

## 2018-07-26 MED FILL — CARBAMAZEPINE XR 200 MG TAB: 200 | 30 days supply | Qty: 60 | Fill #0

## 2018-07-26 MED FILL — QUETIAPINE 300 MG TABLET: 300 | 30 days supply | Qty: 30 | Fill #0

## 2018-07-26 MED FILL — LOSARTAN POTASSIUM 25 MG TA: 25 | 30 days supply | Qty: 90 | Fill #0

## 2018-07-27 ENCOUNTER — Other Ambulatory Visit: Payer: Self-pay | Admitting: Gastroenterology

## 2018-07-27 DIAGNOSIS — R197 Diarrhea, unspecified: Secondary | ICD-10-CM | POA: Diagnosis not present

## 2018-07-27 DIAGNOSIS — R1084 Generalized abdominal pain: Secondary | ICD-10-CM

## 2018-08-05 ENCOUNTER — Other Ambulatory Visit: Payer: Self-pay

## 2018-08-05 ENCOUNTER — Ambulatory Visit
Admission: RE | Admit: 2018-08-05 | Discharge: 2018-08-05 | Disposition: A | Discharge status: Home / Self Care | Payer: Medicare Other | Source: Ambulatory Visit | Attending: Gastroenterology | Admitting: Gastroenterology

## 2018-08-05 DIAGNOSIS — R197 Diarrhea, unspecified: Secondary | ICD-10-CM | POA: Diagnosis not present

## 2018-08-05 DIAGNOSIS — R1110 Vomiting, unspecified: Secondary | ICD-10-CM | POA: Diagnosis not present

## 2018-08-05 DIAGNOSIS — R1084 Generalized abdominal pain: Secondary | ICD-10-CM

## 2018-08-05 DIAGNOSIS — R1012 Left upper quadrant pain: Secondary | ICD-10-CM | POA: Diagnosis not present

## 2018-08-05 MED ORDER — IOPAMIDOL (ISOVUE-300) INJECTION 61%
100.0000 mL | Freq: Once | INTRAVENOUS | Status: AC | PRN
  Administered 2018-08-05: 11:00:00 100 mL via INTRAVENOUS

## 2018-08-17 MED FILL — PEG-3350 SOLUTION: 420 | 1 days supply | Qty: 4000 | Fill #0

## 2018-08-18 DIAGNOSIS — Z1159 Encounter for screening for other viral diseases: Secondary | ICD-10-CM | POA: Diagnosis not present

## 2018-08-23 DIAGNOSIS — R1084 Generalized abdominal pain: Secondary | ICD-10-CM | POA: Diagnosis not present

## 2018-08-23 DIAGNOSIS — R197 Diarrhea, unspecified: Secondary | ICD-10-CM | POA: Diagnosis not present

## 2018-08-23 DIAGNOSIS — K635 Polyp of colon: Secondary | ICD-10-CM | POA: Diagnosis not present

## 2018-08-23 DIAGNOSIS — K3189 Other diseases of stomach and duodenum: Secondary | ICD-10-CM | POA: Diagnosis not present

## 2018-08-23 DIAGNOSIS — K2900 Acute gastritis without bleeding: Secondary | ICD-10-CM | POA: Diagnosis not present

## 2018-08-23 DIAGNOSIS — R112 Nausea with vomiting, unspecified: Secondary | ICD-10-CM | POA: Diagnosis not present

## 2018-08-23 DIAGNOSIS — D122 Benign neoplasm of ascending colon: Secondary | ICD-10-CM | POA: Diagnosis not present

## 2018-08-23 DIAGNOSIS — K319 Disease of stomach and duodenum, unspecified: Secondary | ICD-10-CM | POA: Diagnosis not present

## 2018-08-23 DIAGNOSIS — D124 Benign neoplasm of descending colon: Secondary | ICD-10-CM | POA: Diagnosis not present

## 2018-08-24 MED FILL — NUEDEXTA 20-10 MG CAPSULE: 20-10 | 30 days supply | Qty: 60 | Fill #1

## 2018-08-24 MED FILL — LOSARTAN POTASSIUM 25 MG TA: 25 | 30 days supply | Qty: 90 | Fill #1

## 2018-08-24 MED FILL — CARBAMAZEPINE XR 200 MG TAB: 200 | 30 days supply | Qty: 60 | Fill #1

## 2018-08-24 MED FILL — QUETIAPINE 300 MG TABLET: 300 | 30 days supply | Qty: 30 | Fill #1

## 2018-08-26 DIAGNOSIS — K635 Polyp of colon: Secondary | ICD-10-CM | POA: Diagnosis not present

## 2018-08-26 DIAGNOSIS — D122 Benign neoplasm of ascending colon: Secondary | ICD-10-CM | POA: Diagnosis not present

## 2018-08-26 DIAGNOSIS — D124 Benign neoplasm of descending colon: Secondary | ICD-10-CM | POA: Diagnosis not present

## 2018-08-26 DIAGNOSIS — K319 Disease of stomach and duodenum, unspecified: Secondary | ICD-10-CM | POA: Diagnosis not present

## 2018-09-22 MED FILL — CARBAMAZEPINE XR 200 MG TAB: 200 | 30 days supply | Qty: 60 | Fill #2

## 2018-09-22 MED FILL — LOSARTAN POTASSIUM 25 MG TA: 25 | 30 days supply | Qty: 90 | Fill #2

## 2018-09-22 MED FILL — QUETIAPINE 300 MG TABLET: 300 | 30 days supply | Qty: 30 | Fill #2

## 2018-09-22 MED FILL — NUEDEXTA 20-10 MG CAPSULE: 20-10 | 30 days supply | Qty: 60 | Fill #2

## 2018-09-22 MED FILL — PANTOPRAZOLE SOD DR 40 MG T: 40 | 90 days supply | Qty: 180 | Fill #0

## 2018-10-17 MED FILL — NUEDEXTA 20-10 MG CAPSULE: 20-10 | 30 days supply | Qty: 60 | Fill #0

## 2018-10-17 MED FILL — QUETIAPINE 300 MG TABLET: 300 | 30 days supply | Qty: 30 | Fill #0

## 2018-10-17 MED FILL — CARBAMAZEPINE XR 200 MG TAB: 200 | 30 days supply | Qty: 60 | Fill #0

## 2018-11-05 MED FILL — LOSARTAN POTASSIUM 25 MG TA: 25 | 30 days supply | Qty: 90 | Fill #3

## 2018-11-11 MED FILL — CARBAMAZEPINE XR 200 MG TAB: 200 | 30 days supply | Qty: 60 | Fill #0

## 2018-11-11 MED FILL — QUETIAPINE 300 MG TABLET: 300 | 30 days supply | Qty: 30 | Fill #0

## 2018-11-11 MED FILL — NUEDEXTA 20-10 MG CAPSULE: 20-10 | 30 days supply | Qty: 60 | Fill #0

## 2018-12-12 MED FILL — LOSARTAN POTASSIUM 25 MG TA: 25 | 30 days supply | Qty: 90 | Fill #4

## 2018-12-16 MED FILL — QUETIAPINE 300 MG TABLET: 300 | 30 days supply | Qty: 30 | Fill #1

## 2018-12-16 MED FILL — CARBAMAZEPINE XR 200 MG TAB: 200 | 30 days supply | Qty: 60 | Fill #1

## 2018-12-19 MED FILL — PANTOPRAZOLE SOD DR 40 MG T: 40 | 90 days supply | Qty: 180 | Fill #0

## 2019-01-03 MED FILL — QUETIAPINE FUMARATE 400 MG: 400 | 30 days supply | Qty: 30 | Fill #0

## 2019-01-10 MED FILL — LOSARTAN POTASSIUM 25 MG TA: 25 | 30 days supply | Qty: 90 | Fill #5

## 2019-01-15 MED FILL — NUEDEXTA 20-10 MG CAPSULE: 20-10 | 30 days supply | Qty: 60 | Fill #0

## 2019-01-16 MED FILL — CARBAMAZEPINE XR 200 MG TAB: 200 | 30 days supply | Qty: 60 | Fill #2

## 2019-01-26 DIAGNOSIS — L723 Sebaceous cyst: Secondary | ICD-10-CM | POA: Diagnosis not present

## 2019-01-26 DIAGNOSIS — I10 Essential (primary) hypertension: Secondary | ICD-10-CM | POA: Diagnosis not present

## 2019-01-26 DIAGNOSIS — K219 Gastro-esophageal reflux disease without esophagitis: Secondary | ICD-10-CM | POA: Diagnosis not present

## 2019-01-26 DIAGNOSIS — B351 Tinea unguium: Secondary | ICD-10-CM | POA: Diagnosis not present

## 2019-01-26 DIAGNOSIS — F17200 Nicotine dependence, unspecified, uncomplicated: Secondary | ICD-10-CM | POA: Diagnosis not present

## 2019-01-26 DIAGNOSIS — F319 Bipolar disorder, unspecified: Secondary | ICD-10-CM | POA: Diagnosis not present

## 2019-01-26 MED FILL — LOSARTAN POTASSIUM 100 MG T: 100 | 30 days supply | Qty: 30 | Fill #0

## 2019-01-26 MED FILL — CEPHALEXIN 500 MG CAPSULE: 500 | 7 days supply | Qty: 14 | Fill #0

## 2019-02-01 ENCOUNTER — Ambulatory Visit: Payer: Medicare Other | Admitting: Podiatry

## 2019-02-02 MED FILL — QUETIAPINE FUMARATE 400 MG: 400 | 30 days supply | Qty: 30 | Fill #1

## 2019-02-17 MED FILL — CARBAMAZEPINE XR 200 MG TAB: 200 | 30 days supply | Qty: 60 | Fill #0

## 2019-02-28 MED FILL — NUEDEXTA 20-10 MG CAPSULE: 20-10 | 30 days supply | Qty: 60 | Fill #0

## 2019-03-03 MED FILL — QUETIAPINE FUMARATE 400 MG: 400 | 30 days supply | Qty: 30 | Fill #2

## 2019-03-03 MED FILL — LOSARTAN POTASSIUM 100 MG T: 100 | 30 days supply | Qty: 30 | Fill #1

## 2019-03-21 MED FILL — CARBAMAZEPINE XR 200 MG TAB: 200 | 30 days supply | Qty: 60 | Fill #1

## 2019-03-29 MED FILL — NUEDEXTA 20-10 MG CAPSULE: 20-10 | 30 days supply | Qty: 60 | Fill #1

## 2019-03-29 MED FILL — PANTOPRAZOLE SOD DR 40 MG T: 40 | 90 days supply | Qty: 180 | Fill #0

## 2019-04-01 MED FILL — LOSARTAN POTASSIUM 100 MG T: 100 | 30 days supply | Qty: 30 | Fill #2

## 2019-04-06 MED FILL — QUETIAPINE FUMARATE 400 MG: 400 | 30 days supply | Qty: 30 | Fill #0

## 2019-04-17 MED FILL — CARBAMAZEPINE XR 200 MG TAB: 200 | 30 days supply | Qty: 60 | Fill #2

## 2019-04-26 MED FILL — LOSARTAN POTASSIUM 100 MG T: 100 | 30 days supply | Qty: 30 | Fill #3

## 2019-04-26 MED FILL — NUEDEXTA 20-10 MG CAPSULE: 20-10 | 30 days supply | Qty: 60 | Fill #2

## 2019-05-02 DIAGNOSIS — K580 Irritable bowel syndrome with diarrhea: Secondary | ICD-10-CM | POA: Diagnosis not present

## 2019-05-02 DIAGNOSIS — R112 Nausea with vomiting, unspecified: Secondary | ICD-10-CM | POA: Diagnosis not present

## 2019-05-02 DIAGNOSIS — Z86010 Personal history of colonic polyps: Secondary | ICD-10-CM | POA: Diagnosis not present

## 2019-05-02 MED FILL — DICYCLOMINE 20 MG TABLET: 20 | 20 days supply | Qty: 60 | Fill #0

## 2019-05-05 MED FILL — QUETIAPINE FUMARATE 400 MG: 400 | 30 days supply | Qty: 30 | Fill #1

## 2019-05-22 MED FILL — CARBAMAZEPINE ER 200 MG TAB: 200 | 30 days supply | Qty: 60 | Fill #0

## 2019-05-23 DIAGNOSIS — Z20822 Contact with and (suspected) exposure to covid-19: Secondary | ICD-10-CM | POA: Diagnosis not present

## 2019-05-23 DIAGNOSIS — Z20828 Contact with and (suspected) exposure to other viral communicable diseases: Secondary | ICD-10-CM | POA: Diagnosis not present

## 2019-05-24 MED FILL — LOSARTAN POTASSIUM 100 MG T: 100 | 30 days supply | Qty: 30 | Fill #4

## 2019-05-25 MED FILL — NUEDEXTA 20-10 MG CAPSULE: 20-10 | 30 days supply | Qty: 60 | Fill #0

## 2019-06-07 ENCOUNTER — Ambulatory Visit (INDEPENDENT_AMBULATORY_CARE_PROVIDER_SITE_OTHER): Payer: Medicare Other | Admitting: Podiatry

## 2019-06-07 ENCOUNTER — Other Ambulatory Visit: Payer: Self-pay

## 2019-06-07 ENCOUNTER — Encounter: Payer: Self-pay | Admitting: Podiatry

## 2019-06-07 DIAGNOSIS — L600 Ingrowing nail: Secondary | ICD-10-CM

## 2019-06-07 MED ORDER — NEOMYCIN-POLYMYXIN-HC 3.5-10000-1 OT SOLN: OTIC | 0 refills | Status: AC

## 2019-06-07 NOTE — Patient Instructions (Signed)

## 2019-06-07 NOTE — Progress Notes (Signed)
Subjective:   Patient ID: Wesley Elliott, male   DOB: 39 y.o.   MRN: [id number]   HPI Patient presents with severe nail disease bilateral hallux second right fourth left that become very dystrophic and he absolutely cannot cut and they become more more of an issue and he is unable now to do anything with them and they are painful with shoe gear.  He does smoke a pack per day and likes to be active and does not drink   Review of Systems  All other systems reviewed and are negative.       Objective:  Physical Exam Vitals and nursing note reviewed.  Constitutional:      Appearance: He is well-developed.  Pulmonary:     Effort: Pulmonary effort is normal.  Musculoskeletal:        General: Normal range of motion.  Skin:    General: Skin is warm.  Neurological:     Mental Status: He is alert.     Neurovascular status found to be intact muscle strength was found to be adequate range of motion within normal limits.  Patient is found to have severely dystrophic hallux nails bilateral second right fourth left that are very damaged and they are painful when pressed from a dorsal direction with a long-term history of abnormal nail growth.  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  I do think she has nail removal due to the long-term history of this and patient wants to undergo this understanding procedure and risks.  Patient will have 1% of the time we will do the right foot first and I went ahead today and I infiltrated the right hallux second toe 120 mg like Marcaine mixture sterile prep applied to each toe and using sterile instrumentation remove the hallux and second nails exposed the matrix applied chemical phenol 5 applications 30 seconds followed by alcohol lavage  sterile dressing and gave instructions on soaks.  Encouraged to leave dressing on 24 hours but take it off earlier if throbbing were to occur and wrote prescription for drops and encouraged to call with questions concerns which may arise.  Will reappoint for correction of the left nails in approximate 3 weeks

## 2019-06-21 MED FILL — CARBAMAZEPINE XR 200 MG TAB: 200 | 30 days supply | Qty: 60 | Fill #0

## 2019-06-28 ENCOUNTER — Ambulatory Visit: Payer: Medicare Other | Admitting: Podiatry

## 2019-06-29 ENCOUNTER — Ambulatory Visit (INDEPENDENT_AMBULATORY_CARE_PROVIDER_SITE_OTHER): Payer: Medicare Other | Admitting: Podiatry

## 2019-06-29 ENCOUNTER — Encounter: Payer: Self-pay | Admitting: Podiatry

## 2019-06-29 ENCOUNTER — Other Ambulatory Visit: Payer: Self-pay

## 2019-06-29 VITALS — Temp 97.0°F

## 2019-06-29 DIAGNOSIS — L600 Ingrowing nail: Secondary | ICD-10-CM

## 2019-06-29 MED ORDER — NEOMYCIN-POLYMYXIN-HC 3.5-10000-1 OT SOLN: OTIC | 0 refills | Status: AC

## 2019-06-29 NOTE — Patient Instructions (Signed)

## 2019-07-02 NOTE — Progress Notes (Signed)
Subjective:   Patient ID: Wesley Elliott, male   DOB: 39 y.o.   MRN: [id number]   HPI Patient presents stating the right nails are doing really well and 93 nails removed on my left that have been bad and they are very deformed very painful and I have not been able to cut them for a long time and I am very pleased with the right   ROS      Objective:  Physical Exam  Neurovascular status intact with patient found to have well-healed nail sites right with severely ingrown deformed thickened nails 1 2 and 4 on the left that are very painful when pressed     Assessment:  Doing well post nail surgery right with severe nail disease of the hallux second and fourth nails left     Plan:  H&P recommended correction patient wants this done.  At this point I did allow him to read consent form and I infiltrated the toes with 180 mg Xylocaine Marcaine mixture sterile prep done and using sterile instrumentation I remove the hallux the second and fourth nails left I exposed matrix applied phenol 5 applications 30 seconds followed by alcohol lavage and sterile dressing.  Gave instructions on soaks leave dressing on 24 hours but take them off earlier if needed and drops will be continued.  Encourage patient to call with questions

## 2019-07-06 DIAGNOSIS — M25551 Pain in right hip: Secondary | ICD-10-CM | POA: Diagnosis not present

## 2019-07-20 MED FILL — CARBAMAZEPINE XR 200 MG TAB: 200 | 30 days supply | Qty: 60 | Fill #1

## 2019-07-21 MED FILL — QUETIAPINE FUMARATE 400 MG: 400 | 30 days supply | Qty: 30 | Fill #1

## 2019-07-21 MED FILL — LOSARTAN POTASSIUM 100 MG T: 100 | 30 days supply | Qty: 30 | Fill #6

## 2019-07-21 MED FILL — NUEDEXTA 20-10 MG CAPSULE: 20-10 | 30 days supply | Qty: 60 | Fill #1

## 2019-07-26 DIAGNOSIS — I10 Essential (primary) hypertension: Secondary | ICD-10-CM | POA: Diagnosis not present

## 2019-07-26 DIAGNOSIS — E7800 Pure hypercholesterolemia, unspecified: Secondary | ICD-10-CM | POA: Diagnosis not present

## 2019-07-26 DIAGNOSIS — F17200 Nicotine dependence, unspecified, uncomplicated: Secondary | ICD-10-CM | POA: Diagnosis not present

## 2019-07-26 DIAGNOSIS — Z87820 Personal history of traumatic brain injury: Secondary | ICD-10-CM | POA: Diagnosis not present

## 2019-07-26 DIAGNOSIS — Z131 Encounter for screening for diabetes mellitus: Secondary | ICD-10-CM | POA: Diagnosis not present

## 2019-07-26 DIAGNOSIS — F3177 Bipolar disorder, in partial remission, most recent episode mixed: Secondary | ICD-10-CM | POA: Diagnosis not present

## 2019-07-26 DIAGNOSIS — K580 Irritable bowel syndrome with diarrhea: Secondary | ICD-10-CM | POA: Diagnosis not present

## 2019-07-26 DIAGNOSIS — K219 Gastro-esophageal reflux disease without esophagitis: Secondary | ICD-10-CM | POA: Diagnosis not present

## 2019-08-24 MED FILL — QUETIAPINE FUMARATE 400 MG: 400 | 30 days supply | Qty: 30 | Fill #2

## 2019-08-24 MED FILL — NUEDEXTA 20-10 MG CAPSULE: 20-10 | 30 days supply | Qty: 60 | Fill #2

## 2019-08-24 MED FILL — CARBAMAZEPINE XR 200 MG TAB: 200 | 30 days supply | Qty: 60 | Fill #2

## 2019-08-24 MED FILL — LOSARTAN POTASSIUM 100 MG T: 100 | 30 days supply | Qty: 30 | Fill #7

## 2019-09-21 MED FILL — NUEDEXTA 20-10 MG CAPSULE: 20-10 | 30 days supply | Qty: 60 | Fill #2

## 2019-09-21 MED FILL — LOSARTAN POTASSIUM 100 MG T: 100 | 30 days supply | Qty: 30 | Fill #8

## 2019-09-22 ENCOUNTER — Other Ambulatory Visit (HOSPITAL_COMMUNITY): Payer: Self-pay | Admitting: Family Medicine

## 2019-09-22 MED FILL — CARBAMAZEPINE XR 200 MG TAB: 200 | 30 days supply | Qty: 60 | Fill #0

## 2019-09-22 MED FILL — QUETIAPINE FUMARATE 400 MG: 400 | 30 days supply | Qty: 30 | Fill #0

## 2019-09-22 MED FILL — PANTOPRAZOLE SOD DR 40 MG T: 40 | 90 days supply | Qty: 180 | Fill #0

## 2019-10-25 ENCOUNTER — Other Ambulatory Visit (HOSPITAL_COMMUNITY): Payer: Self-pay | Admitting: Physician Assistant

## 2019-10-25 MED FILL — QUETIAPINE FUMARATE 400 MG: 400 | 30 days supply | Qty: 30 | Fill #0

## 2019-10-25 MED FILL — NUEDEXTA 20-10 MG CAPSULE: 20-10 | 30 days supply | Qty: 60 | Fill #0

## 2019-10-25 MED FILL — CARBAMAZEPINE XR 200 MG TAB: 200 | 30 days supply | Qty: 60 | Fill #0

## 2019-10-27 MED FILL — LOSARTAN POTASSIUM 100 MG T: 100 | 30 days supply | Qty: 30 | Fill #9

## 2019-11-23 IMAGING — CT CT ABDOMEN AND PELVIS WITH CONTRAST
1 of 3 series · 13 of 32 positions shown, 19 images · IV contrast (iopamidol)
Comparison: None

CLINICAL DATA: Left upper quadrant pain with difficulty eating for
2-3 weeks. Reports vomiting and diarrhea. Positive smoking history.

EXAM:
CT ABDOMEN AND PELVIS WITH CONTRAST
TECHNIQUE: Multidetector CT imaging of the abdomen and pelvis was performed
using the standard protocol following bolus administration of
intravenous contrast.
CONTRAST:  100mL [id number] IOPAMIDOL ([id number]) INJECTION 61%

[Series 2: abd/pelvis w/cm · axial · 0.98mm/px · z∈[-508,-78]mm · 13 of 100 slices shown, 19 images]
[im 7/100  soft-tissue]
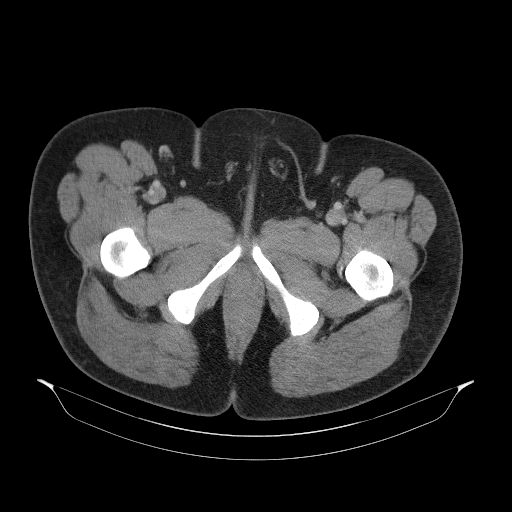
[im 7/100  bone]
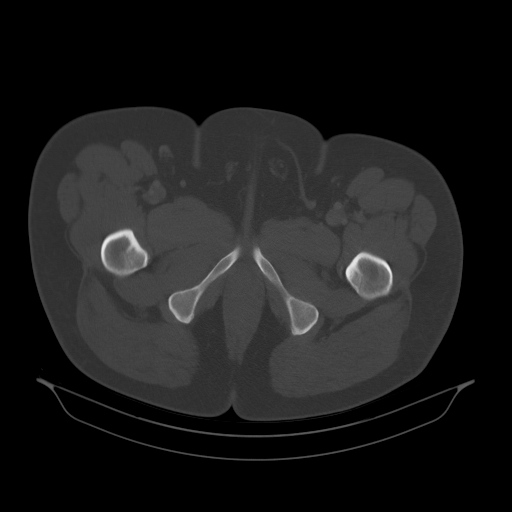
[im 14/100  soft-tissue]
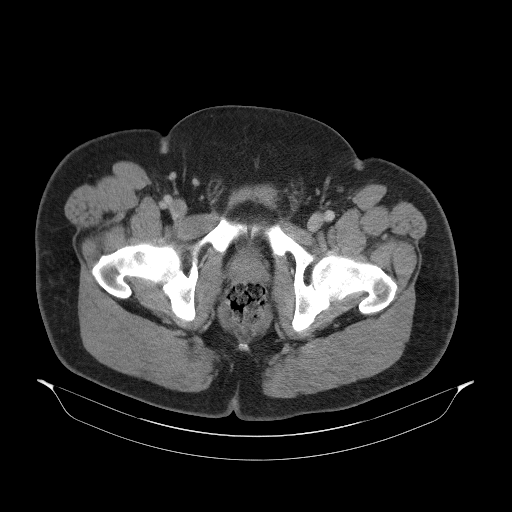
[im 20/100  soft-tissue]
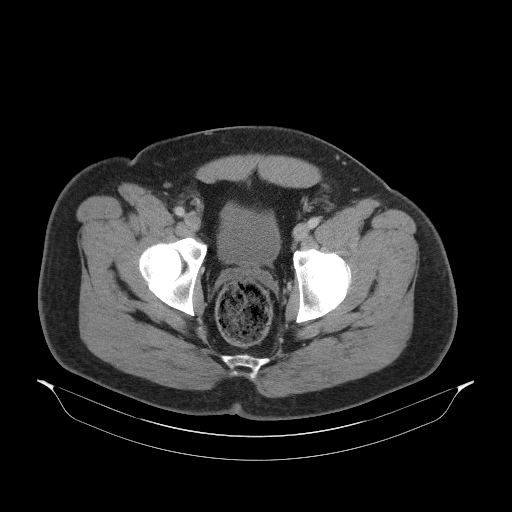
[im 27/100  soft-tissue]
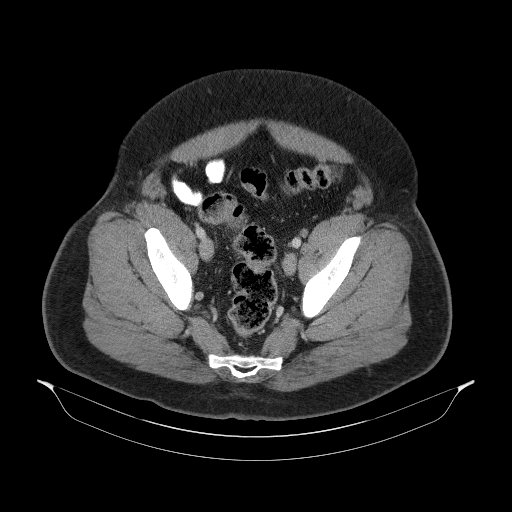
[im 34/100  soft-tissue]
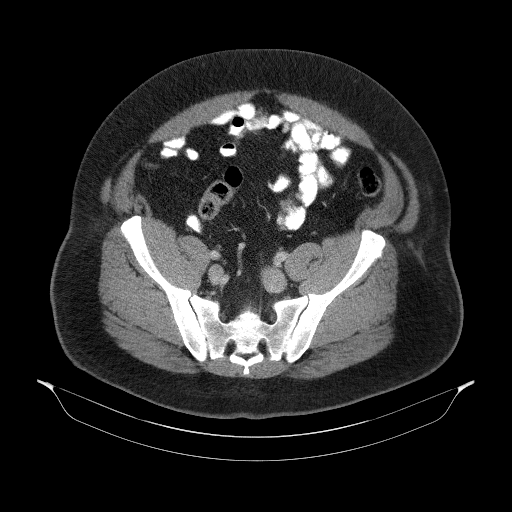
[im 40/100  soft-tissue]
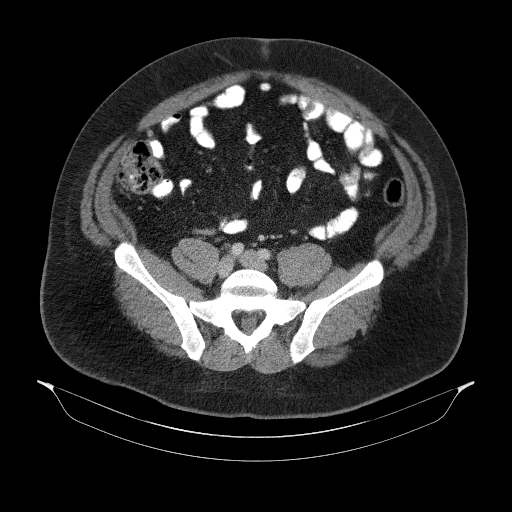
[im 53/100  soft-tissue]
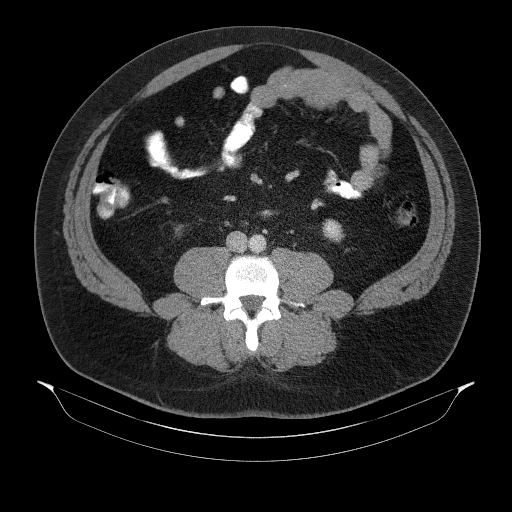
[im 60/100  soft-tissue]
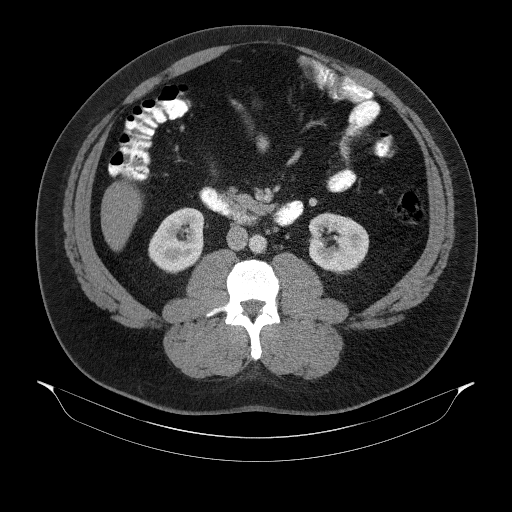
[im 67/100  soft-tissue]
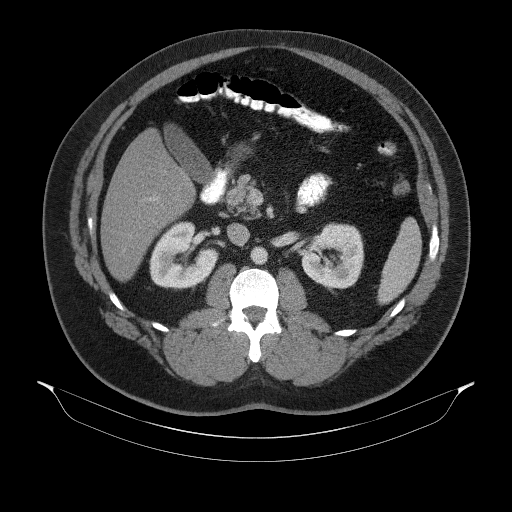
[im 67/100  bone]
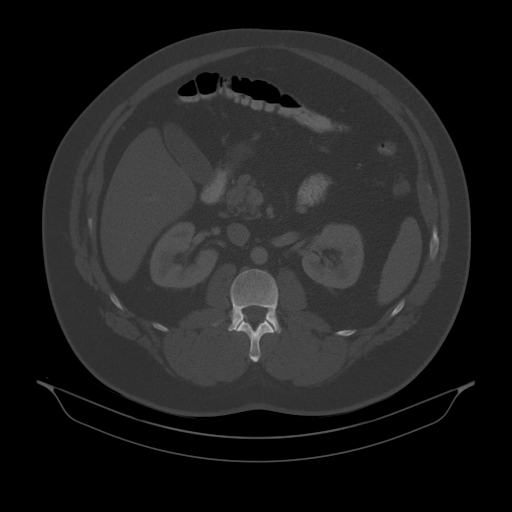
[im 73/100  soft-tissue]
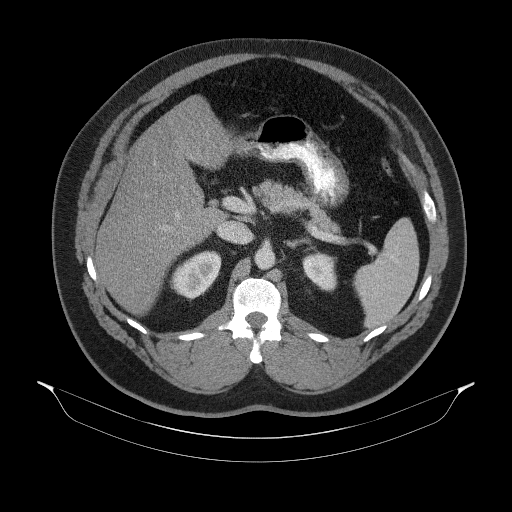
[im 73/100  lung]
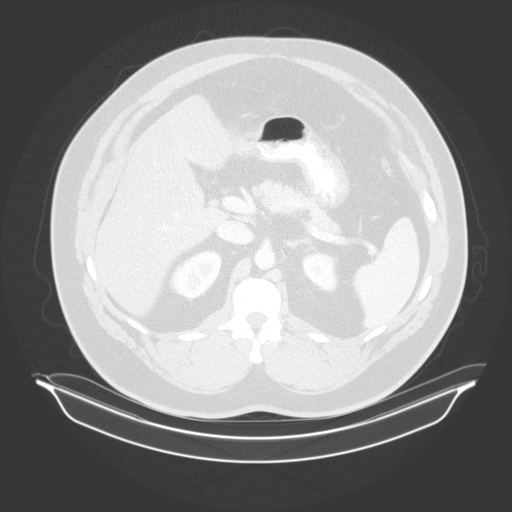
[im 80/100  soft-tissue]
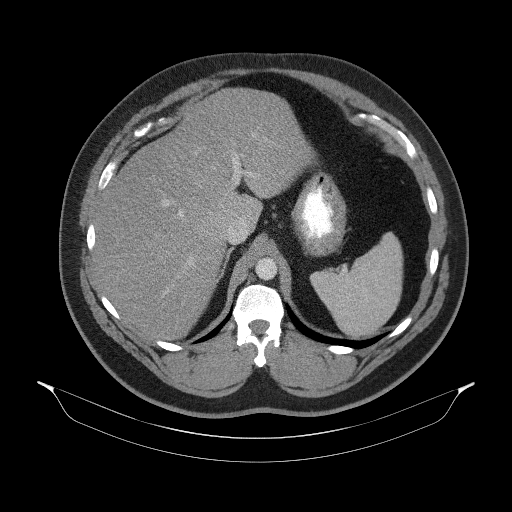
[im 80/100  lung]
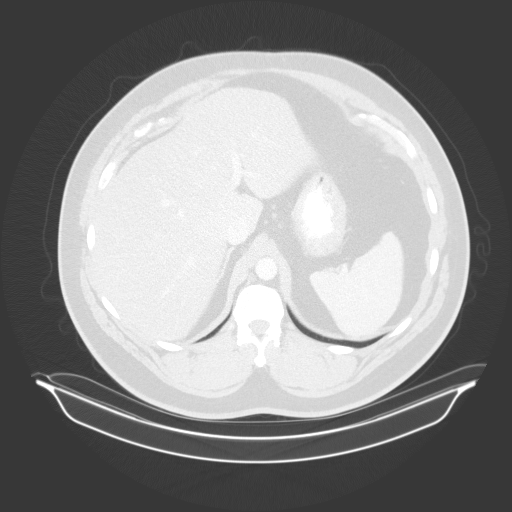
[im 86/100  soft-tissue]
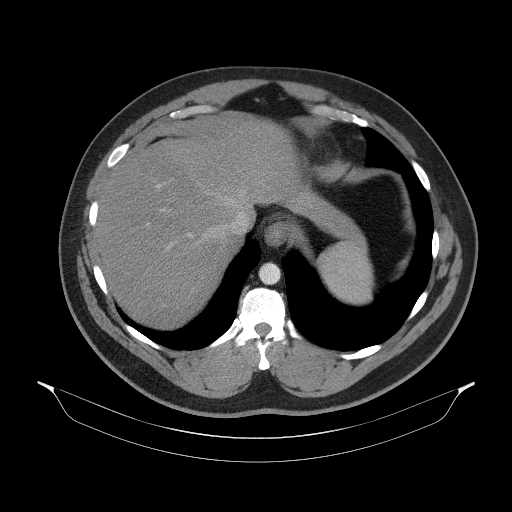
[im 86/100  lung]
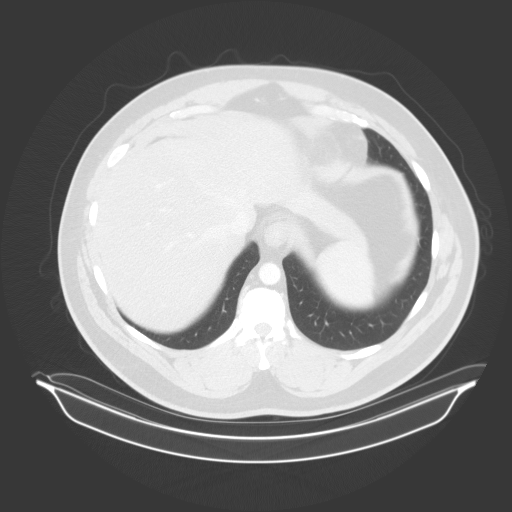
[im 93/100  soft-tissue]
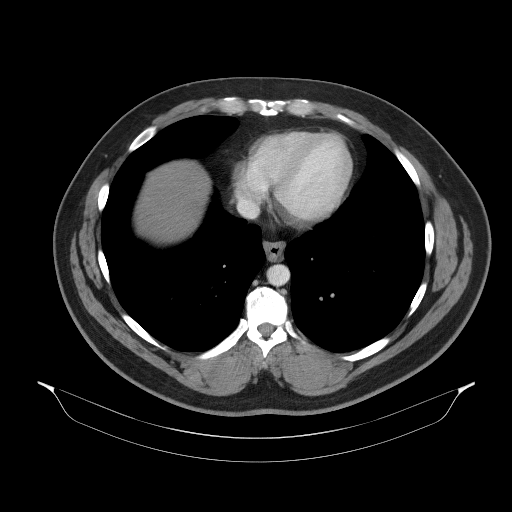
[im 93/100  lung]
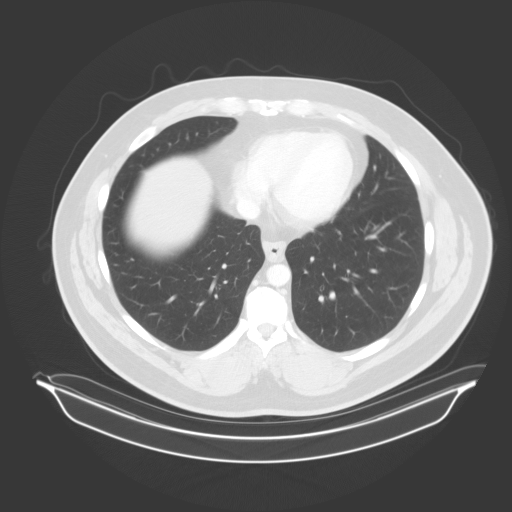

[13 of 32 positions shown; findings below may reference images not displayed]

FINDINGS: Lower chest: No acute abnormality.

Hepatobiliary: No focal liver lesion. Gallbladder is unremarkable.
No biliary ductal dilatation.

Pancreas: Unremarkable. No pancreatic ductal dilatation or
surrounding inflammatory changes.

Spleen: Measures 5.5 by 10.0 x 14.1 cm (volume = 410 cm^3).

Adrenals/Urinary Tract: Normal appearance of the adrenal glands. No
kidney mass or hydronephrosis identified. Urinary bladder appears
normal.

Stomach/Bowel: Stomach is within normal limits. Appendix appears
normal. No evidence of bowel wall thickening, distention, or
inflammatory changes.

Vascular/Lymphatic: No significant vascular findings are present. No
enlarged abdominal or pelvic lymph nodes.

Reproductive: Prostate is unremarkable.

Other: No free fluid or fluid collections identified within the
abdomen or pelvis.

Musculoskeletal: No acute or significant osseous findings.
IMPRESSION: No acute findings and no explanation for patient's left upper
quadrant pain.

Mild splenomegaly with volume of 410 cc and cranial caudal dimension
of 14.1 cm.

## 2019-11-23 MED FILL — NUEDEXTA 20-10 MG CAPSULE: 20-10 | 30 days supply | Qty: 60 | Fill #1

## 2019-11-23 MED FILL — QUETIAPINE FUMARATE 400 MG: 400 | 30 days supply | Qty: 30 | Fill #1

## 2019-12-25 ENCOUNTER — Other Ambulatory Visit (HOSPITAL_COMMUNITY): Payer: Self-pay | Admitting: Physician Assistant

## 2019-12-25 MED FILL — PANTOPRAZOLE SOD DR 40 MG T: 40 | 90 days supply | Qty: 180 | Fill #1

## 2019-12-25 MED FILL — QUETIAPINE FUMARATE 400 MG: 400 | 30 days supply | Qty: 30 | Fill #0

## 2019-12-28 ENCOUNTER — Other Ambulatory Visit (HOSPITAL_COMMUNITY): Payer: Self-pay | Admitting: Physician Assistant

## 2019-12-28 MED FILL — NUEDEXTA 20-10 MG CAPSULE: 20-10 | 30 days supply | Qty: 60 | Fill #0

## 2019-12-29 ENCOUNTER — Other Ambulatory Visit (HOSPITAL_COMMUNITY): Payer: Self-pay | Admitting: Physician Assistant

## 2019-12-29 MED FILL — CARBAMAZEPINE XR 200 MG TAB: 200 | 30 days supply | Qty: 60 | Fill #0

## 2020-01-22 MED FILL — QUETIAPINE FUMARATE 400 MG: 400 | 30 days supply | Qty: 30 | Fill #0

## 2020-01-22 MED FILL — NUEDEXTA 20-10 MG CAPSULE: 20-10 | 30 days supply | Qty: 60 | Fill #0

## 2020-01-31 ENCOUNTER — Other Ambulatory Visit (HOSPITAL_COMMUNITY): Payer: Self-pay | Admitting: Family Medicine

## 2020-01-31 DIAGNOSIS — Z23 Encounter for immunization: Secondary | ICD-10-CM | POA: Diagnosis not present

## 2020-01-31 DIAGNOSIS — I10 Essential (primary) hypertension: Secondary | ICD-10-CM | POA: Diagnosis not present

## 2020-01-31 DIAGNOSIS — K580 Irritable bowel syndrome with diarrhea: Secondary | ICD-10-CM | POA: Diagnosis not present

## 2020-01-31 DIAGNOSIS — K219 Gastro-esophageal reflux disease without esophagitis: Secondary | ICD-10-CM | POA: Diagnosis not present

## 2020-01-31 DIAGNOSIS — Z87820 Personal history of traumatic brain injury: Secondary | ICD-10-CM | POA: Diagnosis not present

## 2020-01-31 DIAGNOSIS — F3177 Bipolar disorder, in partial remission, most recent episode mixed: Secondary | ICD-10-CM | POA: Diagnosis not present

## 2020-01-31 MED FILL — LOSARTAN POTASSIUM 100 MG T: 100 | 90 days supply | Qty: 90 | Fill #0

## 2020-02-05 MED FILL — CARBAMAZEPINE XR 200 MG TAB: 200 | 30 days supply | Qty: 60 | Fill #1

## 2020-02-17 MED FILL — NUEDEXTA 20-10 MG CAPSULE: 20-10 | 30 days supply | Qty: 60 | Fill #1

## 2020-02-17 MED FILL — QUETIAPINE FUMARATE 400 MG: 400 | 30 days supply | Qty: 30 | Fill #1

## 2020-03-09 MED FILL — CARBAMAZEPINE XR 200 MG TAB: 200 | 30 days supply | Qty: 60 | Fill #2

## 2020-03-11 ENCOUNTER — Other Ambulatory Visit (HOSPITAL_COMMUNITY): Payer: Self-pay | Admitting: Family Medicine

## 2020-03-19 MED FILL — QUETIAPINE FUMARATE 400 MG: 400 | 30 days supply | Qty: 30 | Fill #2

## 2020-03-19 MED FILL — NUEDEXTA 20-10 MG CAPSULE: 20-10 | 30 days supply | Qty: 60 | Fill #2

## 2020-03-22 ENCOUNTER — Other Ambulatory Visit (HOSPITAL_COMMUNITY): Payer: Self-pay | Admitting: Family Medicine

## 2020-03-22 MED FILL — PANTOPRAZOLE SOD DR 40 MG T: 40 | 90 days supply | Qty: 180 | Fill #0

## 2020-04-08 ENCOUNTER — Other Ambulatory Visit (HOSPITAL_COMMUNITY): Payer: Self-pay | Admitting: Physician Assistant

## 2020-04-12 ENCOUNTER — Other Ambulatory Visit (HOSPITAL_COMMUNITY): Payer: Self-pay | Admitting: Physician Assistant

## 2020-04-12 MED FILL — NUEDEXTA 20-10 MG CAPSULE: 20-10 | 30 days supply | Qty: 60 | Fill #0

## 2020-04-12 MED FILL — CARBAMAZEPINE XR 200 MG TAB: 200 | 30 days supply | Qty: 60 | Fill #0

## 2020-04-20 ENCOUNTER — Other Ambulatory Visit (HOSPITAL_COMMUNITY): Payer: Self-pay

## 2020-04-22 ENCOUNTER — Other Ambulatory Visit (HOSPITAL_COMMUNITY): Payer: Self-pay

## 2020-04-22 MED FILL — Quetiapine Fumarate Tab 400 MG: ORAL | 30 days supply | Qty: 30 | Fill #0 | Status: AC

## 2020-04-23 ENCOUNTER — Other Ambulatory Visit (HOSPITAL_COMMUNITY): Payer: Self-pay

## 2020-04-23 MED FILL — Losartan Potassium Tab 100 MG: ORAL | 90 days supply | Qty: 90 | Fill #0 | Status: AC

## 2020-05-09 MED FILL — Quetiapine Fumarate Tab 400 MG: ORAL | 30 days supply | Qty: 30 | Fill #1 | Status: AC

## 2020-05-10 ENCOUNTER — Other Ambulatory Visit (HOSPITAL_COMMUNITY): Payer: Self-pay

## 2020-05-16 ENCOUNTER — Other Ambulatory Visit (HOSPITAL_COMMUNITY): Payer: Self-pay

## 2020-05-17 ENCOUNTER — Other Ambulatory Visit (HOSPITAL_COMMUNITY): Payer: Self-pay

## 2020-05-20 ENCOUNTER — Other Ambulatory Visit (HOSPITAL_COMMUNITY): Payer: Self-pay

## 2020-05-20 MED FILL — Carbamazepine Tab ER 12HR 200 MG: ORAL | 30 days supply | Qty: 60 | Fill #0 | Status: AC

## 2020-05-25 ENCOUNTER — Other Ambulatory Visit (HOSPITAL_COMMUNITY): Payer: Self-pay

## 2020-05-25 MED FILL — Dextromethorphan HBr-Quinidine Sulfate Cap 20-10 MG: ORAL | 30 days supply | Qty: 60 | Fill #0 | Status: AC

## 2020-05-27 ENCOUNTER — Other Ambulatory Visit (HOSPITAL_COMMUNITY): Payer: Self-pay

## 2020-05-28 ENCOUNTER — Other Ambulatory Visit (HOSPITAL_COMMUNITY): Payer: Self-pay

## 2020-06-10 ENCOUNTER — Other Ambulatory Visit (HOSPITAL_COMMUNITY): Payer: Self-pay

## 2020-06-10 MED FILL — Quetiapine Fumarate Tab 400 MG: ORAL | 30 days supply | Qty: 30 | Fill #2 | Status: AC

## 2020-06-10 MED FILL — Pantoprazole Sodium EC Tab 40 MG (Base Equiv): ORAL | 90 days supply | Qty: 180 | Fill #0 | Status: CN

## 2020-06-10 MED FILL — Carbamazepine Tab ER 12HR 200 MG: ORAL | 30 days supply | Qty: 60 | Fill #1 | Status: CN

## 2020-06-11 ENCOUNTER — Other Ambulatory Visit (HOSPITAL_COMMUNITY): Payer: Self-pay

## 2020-06-11 MED FILL — Losartan Potassium Tab 100 MG: ORAL | 90 days supply | Qty: 90 | Fill #1 | Status: CN

## 2020-06-11 MED FILL — Carbamazepine Tab ER 12HR 200 MG: ORAL | 30 days supply | Qty: 60 | Fill #1 | Status: AC

## 2020-06-13 ENCOUNTER — Other Ambulatory Visit (HOSPITAL_COMMUNITY): Payer: Self-pay

## 2020-06-16 MED FILL — Dextromethorphan HBr-Quinidine Sulfate Cap 20-10 MG: ORAL | 30 days supply | Qty: 60 | Fill #1 | Status: AC

## 2020-06-16 MED FILL — Pantoprazole Sodium EC Tab 40 MG (Base Equiv): ORAL | 90 days supply | Qty: 180 | Fill #0 | Status: AC

## 2020-06-18 ENCOUNTER — Other Ambulatory Visit (HOSPITAL_COMMUNITY): Payer: Self-pay

## 2020-06-19 ENCOUNTER — Other Ambulatory Visit (HOSPITAL_COMMUNITY): Payer: Self-pay

## 2020-06-27 ENCOUNTER — Other Ambulatory Visit (HOSPITAL_COMMUNITY): Payer: Self-pay

## 2020-06-27 MED ORDER — NUEDEXTA 20-10 MG PO CAPS
ORAL_CAPSULE | ORAL | 2 refills | Status: AC
  Filled 2020-06-27 – 2020-07-26 (×2): qty 60, 30d supply, fill #0
  Filled 2020-08-24: qty 60, 30d supply, fill #1
  Filled 2020-09-24: qty 60, 30d supply, fill #2

## 2020-06-27 MED ORDER — QUETIAPINE FUMARATE 400 MG PO TABS
ORAL_TABLET | ORAL | 2 refills | Status: DC
  Filled 2020-06-27 – 2020-07-10 (×2): qty 30, 30d supply, fill #0
  Filled 2020-07-29: qty 30, 30d supply, fill #1
  Filled 2020-08-28: qty 30, 30d supply, fill #2

## 2020-06-27 MED ORDER — CARBAMAZEPINE ER 200 MG PO TB12
200.0000 mg | ORAL_TABLET | Freq: Two times a day (BID) | ORAL | 2 refills | Status: AC
  Filled 2020-06-27 – 2020-07-24 (×2): qty 60, 30d supply, fill #0
  Filled 2020-08-24: qty 60, 30d supply, fill #1
  Filled 2021-02-01: qty 50, 25d supply, fill #2
  Filled 2021-02-01: qty 10, 5d supply, fill #2

## 2020-07-04 DIAGNOSIS — Z79899 Other long term (current) drug therapy: Secondary | ICD-10-CM | POA: Diagnosis not present

## 2020-07-06 ENCOUNTER — Other Ambulatory Visit (HOSPITAL_COMMUNITY): Payer: Self-pay

## 2020-07-06 MED FILL — Losartan Potassium Tab 100 MG: ORAL | 90 days supply | Qty: 90 | Fill #1 | Status: AC

## 2020-07-10 ENCOUNTER — Other Ambulatory Visit (HOSPITAL_COMMUNITY): Payer: Self-pay

## 2020-07-18 ENCOUNTER — Other Ambulatory Visit (HOSPITAL_COMMUNITY): Payer: Self-pay

## 2020-07-23 ENCOUNTER — Other Ambulatory Visit (HOSPITAL_COMMUNITY): Payer: Self-pay

## 2020-07-24 ENCOUNTER — Other Ambulatory Visit (HOSPITAL_COMMUNITY): Payer: Self-pay

## 2020-07-25 ENCOUNTER — Other Ambulatory Visit (HOSPITAL_COMMUNITY): Payer: Self-pay

## 2020-07-26 ENCOUNTER — Other Ambulatory Visit (HOSPITAL_COMMUNITY): Payer: Self-pay

## 2020-07-29 ENCOUNTER — Other Ambulatory Visit (HOSPITAL_COMMUNITY): Payer: Self-pay

## 2020-07-30 ENCOUNTER — Other Ambulatory Visit (HOSPITAL_COMMUNITY): Payer: Self-pay

## 2020-07-30 DIAGNOSIS — Z87820 Personal history of traumatic brain injury: Secondary | ICD-10-CM | POA: Diagnosis not present

## 2020-07-30 DIAGNOSIS — F17200 Nicotine dependence, unspecified, uncomplicated: Secondary | ICD-10-CM | POA: Diagnosis not present

## 2020-07-30 DIAGNOSIS — K219 Gastro-esophageal reflux disease without esophagitis: Secondary | ICD-10-CM | POA: Diagnosis not present

## 2020-07-30 DIAGNOSIS — Z0000 Encounter for general adult medical examination without abnormal findings: Secondary | ICD-10-CM | POA: Diagnosis not present

## 2020-07-30 DIAGNOSIS — I10 Essential (primary) hypertension: Secondary | ICD-10-CM | POA: Diagnosis not present

## 2020-07-30 DIAGNOSIS — F3177 Bipolar disorder, in partial remission, most recent episode mixed: Secondary | ICD-10-CM | POA: Diagnosis not present

## 2020-07-30 DIAGNOSIS — E7800 Pure hypercholesterolemia, unspecified: Secondary | ICD-10-CM | POA: Diagnosis not present

## 2020-08-05 ENCOUNTER — Other Ambulatory Visit (HOSPITAL_COMMUNITY): Payer: Self-pay

## 2020-08-26 ENCOUNTER — Other Ambulatory Visit (HOSPITAL_COMMUNITY): Payer: Self-pay

## 2020-08-27 ENCOUNTER — Other Ambulatory Visit (HOSPITAL_COMMUNITY): Payer: Self-pay

## 2020-08-28 ENCOUNTER — Other Ambulatory Visit (HOSPITAL_COMMUNITY): Payer: Self-pay

## 2020-08-31 ENCOUNTER — Other Ambulatory Visit (HOSPITAL_COMMUNITY): Payer: Self-pay

## 2020-09-02 ENCOUNTER — Other Ambulatory Visit (HOSPITAL_COMMUNITY): Payer: Self-pay

## 2020-09-22 ENCOUNTER — Other Ambulatory Visit (HOSPITAL_COMMUNITY): Payer: Self-pay

## 2020-09-23 ENCOUNTER — Other Ambulatory Visit (HOSPITAL_COMMUNITY): Payer: Self-pay

## 2020-09-24 ENCOUNTER — Encounter (HOSPITAL_COMMUNITY): Payer: Self-pay

## 2020-09-24 ENCOUNTER — Emergency Department (HOSPITAL_COMMUNITY): Payer: Medicare Other

## 2020-09-24 ENCOUNTER — Emergency Department (HOSPITAL_COMMUNITY)
Admission: EM | Admit: 2020-09-24 | Discharge: 2020-09-24 | Disposition: A | Discharge status: Home / Self Care | Payer: Medicare Other | Attending: Emergency Medicine | Admitting: Emergency Medicine

## 2020-09-24 ENCOUNTER — Other Ambulatory Visit: Payer: Self-pay

## 2020-09-24 ENCOUNTER — Other Ambulatory Visit (HOSPITAL_COMMUNITY): Payer: Self-pay

## 2020-09-24 DIAGNOSIS — R197 Diarrhea, unspecified: Secondary | ICD-10-CM

## 2020-09-24 DIAGNOSIS — Z720 Tobacco use: Secondary | ICD-10-CM

## 2020-09-24 DIAGNOSIS — F17210 Nicotine dependence, cigarettes, uncomplicated: Secondary | ICD-10-CM | POA: Insufficient documentation

## 2020-09-24 DIAGNOSIS — E860 Dehydration: Secondary | ICD-10-CM | POA: Diagnosis not present

## 2020-09-24 DIAGNOSIS — R519 Headache, unspecified: Secondary | ICD-10-CM | POA: Diagnosis not present

## 2020-09-24 DIAGNOSIS — R112 Nausea with vomiting, unspecified: Secondary | ICD-10-CM | POA: Diagnosis not present

## 2020-09-24 DIAGNOSIS — R062 Wheezing: Secondary | ICD-10-CM | POA: Insufficient documentation

## 2020-09-24 DIAGNOSIS — R0602 Shortness of breath: Secondary | ICD-10-CM | POA: Diagnosis not present

## 2020-09-24 DIAGNOSIS — U071 COVID-19: Secondary | ICD-10-CM | POA: Insufficient documentation

## 2020-09-24 LAB — COMPREHENSIVE METABOLIC PANEL
ALT: 23 U/L (ref 0–44)
AST: 21 U/L (ref 15–41)
Albumin: 4.3 g/dL (ref 3.5–5.0)
Alkaline Phosphatase: 75 U/L (ref 38–126)
Anion gap: 9 (ref 5–15)
BUN: 18 mg/dL (ref 6–20)
CO2: 26 mmol/L (ref 22–32)
Calcium: 9.2 mg/dL (ref 8.9–10.3)
Chloride: 102 mmol/L (ref 98–111)
Creatinine, Ser: 1.07 mg/dL (ref 0.61–1.24)
GFR, Estimated: >60 mL/min (ref >60)
Glucose, Bld: 96 mg/dL (ref 70–99)
Potassium: 4.1 mmol/L (ref 3.5–5.1)
Sodium: 137 mmol/L (ref 135–145)
Total Bilirubin: 0.5 mg/dL (ref 0.3–1.2)
Total Protein: 7.9 g/dL (ref 6.5–8.1)

## 2020-09-24 LAB — CBC WITH DIFFERENTIAL/PLATELET
Abs Immature Granulocytes: 0.02 10*3/uL (ref 0.00–0.07)
Basophils Absolute: 0 10*3/uL (ref 0.0–0.1)
Basophils Relative: 0 %
Eosinophils Absolute: 0 10*3/uL (ref 0.0–0.5)
Eosinophils Relative: 0 %
HCT: 46.6 % (ref 39.0–52.0)
Hemoglobin: 16 g/dL (ref 13.0–17.0)
Immature Granulocytes: 0 %
Lymphocytes Relative: 23 %
Lymphs Abs: 1.4 10*3/uL (ref 0.7–4.0)
MCH: 32.9 pg (ref 26.0–34.0)
MCHC: 34.3 g/dL (ref 30.0–36.0)
MCV: 95.9 fL (ref 80.0–100.0)
Monocytes Absolute: 0.8 10*3/uL (ref 0.1–1.0)
Monocytes Relative: 13 %
Neutro Abs: 3.9 10*3/uL (ref 1.7–7.7)
Neutrophils Relative %: 64 %
Platelets: 221 10*3/uL (ref 150–400)
RBC: 4.86 MIL/uL (ref 4.22–5.81)
RDW: 12.3 % (ref 11.5–15.5)
WBC: 6.1 10*3/uL (ref 4.0–10.5)
nRBC: 0 % (ref 0.0–0.2)

## 2020-09-24 MED ORDER — DEXAMETHASONE SODIUM PHOSPHATE 10 MG/ML IJ SOLN
10.0000 mg | Freq: Once | INTRAMUSCULAR | Status: AC
  Administered 2020-09-24: 10 mg via INTRAVENOUS
  Filled 2020-09-24: qty 1

## 2020-09-24 MED ORDER — ONDANSETRON HCL 4 MG/2ML IJ SOLN
4.0000 mg | Freq: Once | INTRAMUSCULAR | Status: AC
  Administered 2020-09-24: 4 mg via INTRAVENOUS
  Filled 2020-09-24: qty 2

## 2020-09-24 MED ORDER — KETOROLAC TROMETHAMINE 30 MG/ML IJ SOLN
30.0000 mg | Freq: Once | INTRAMUSCULAR | Status: AC
  Administered 2020-09-24: 30 mg via INTRAVENOUS
  Filled 2020-09-24: qty 1

## 2020-09-24 MED ORDER — PREDNISONE 10 MG (21) PO TBPK: ORAL_TABLET | Freq: Every day | ORAL | 0 refills | Status: AC

## 2020-09-24 MED ORDER — ACETAMINOPHEN 325 MG PO TABS
650.0000 mg | ORAL_TABLET | Freq: Once | ORAL | Status: AC
  Administered 2020-09-24: 650 mg via ORAL
  Filled 2020-09-24: qty 2

## 2020-09-24 MED ORDER — IBUPROFEN 600 MG PO TABS: 600.0000 mg | ORAL_TABLET | Freq: Four times a day (QID) | ORAL | 0 refills | Status: AC | PRN

## 2020-09-24 MED ORDER — AEROCHAMBER Z-STAT PLUS/MEDIUM MISC
1.0000 | Freq: Once | Status: AC
  Administered 2020-09-24: 1
  Filled 2020-09-24: qty 1

## 2020-09-24 MED ORDER — ONDANSETRON 4 MG PO TBDP: 4.0000 mg | ORAL_TABLET | Freq: Three times a day (TID) | ORAL | 0 refills | Status: AC | PRN

## 2020-09-24 MED ORDER — PANTOPRAZOLE SODIUM 40 MG PO TBEC
DELAYED_RELEASE_TABLET | ORAL | 2 refills | Status: DC
  Filled 2020-09-24: qty 180, 90d supply, fill #0
  Filled 2020-12-21: qty 180, 90d supply, fill #1
  Filled 2021-01-19 – 2021-03-24 (×2): qty 180, 90d supply, fill #2

## 2020-09-24 MED ORDER — ALBUTEROL SULFATE HFA 108 (90 BASE) MCG/ACT IN AERS
2.0000 | INHALATION_SPRAY | Freq: Once | RESPIRATORY_TRACT | Status: AC
  Administered 2020-09-24: 2 via RESPIRATORY_TRACT
  Filled 2020-09-24: qty 6.7

## 2020-09-24 MED ORDER — SODIUM CHLORIDE 0.9 % IV BOLUS
1000.0000 mL | Freq: Once | INTRAVENOUS | Status: AC
  Administered 2020-09-24: 1000 mL via INTRAVENOUS

## 2020-09-24 NOTE — ED Provider Notes (Signed)
[facility] DEPT Provider Note   CSN: [id number] Arrival date & time: 09/24/20  1700     History Chief Complaint  Patient presents with   Covid Positive   Emesis   Diarrhea    Wesley Elliott is a 40 y.o. male.  Pt presents to the ED today with n/v and diarrhea.  He also has a cough.  This is his 5th day of sx.  Pt has been vaccinated times 2, but did not receive the booster.  Pt took a Covid test today at home and it was +.  Pt said he's been unable to eat or drink anything for 3 days.      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   Traumatic brain injury [facility])     Patient Active Problem List   Diagnosis Date Noted   Intermittent explosive disorder 05/18/2016    Past Surgical History:  Procedure Laterality Date   NO PAST SURGERIES     SHOULDER ACROMIOPLASTY Right 03/17/2013   Procedure: RIGHT SHOULDER ACROMIOPLASTY;  Surgeon: Yvette Rack., MD;  Location: [place];  Service: Orthopedics;  Laterality: Right;   SHOULDER ARTHROSCOPY Right 03/17/2013   Procedure: ARTHROSCOPY RIGHT SHOULDER WITH DEBRIDEMENT;  Surgeon: Yvette Rack., MD;  Location: [place];  Service: Orthopedics;  Laterality: Right;       Family History  Problem Relation Age of Onset   Healthy Mother    Healthy Father     Social History   Tobacco Use   Smoking status: Every Day    Packs/day: 0.50    Years: 19.00    Pack years: 9.50    Types: Cigarettes   Smokeless tobacco: Never  Vaping Use   Vaping Use: Never used  Substance Use Topics   Alcohol use: Yes    Comment: occasionally   Drug use: Not Currently    Home Medications Prior to Admission medications   Medication Sig Start Date End Date Taking? Authorizing Provider  ibuprofen (ADVIL) 600 MG tablet Take 1 tablet (600 mg total) by mouth  every 6 (six) hours as needed. 09/24/20  Yes Isla Pence, MD  ondansetron (ZOFRAN ODT) 4 MG disintegrating tablet Take 1 tablet (4 mg total) by mouth every 8 (eight) hours as needed for nausea or vomiting. 09/24/20  Yes Isla Pence, MD  predniSONE (STERAPRED UNI-PAK 21 TAB) 10 MG (21) TBPK tablet Take by mouth daily. Take 6 tabs by mouth daily  for 2 days, then 5 tabs for 2 days, then 4 tabs for 2 days, then 3 tabs for 2 days, 2 tabs for 2 days, then 1 tab by mouth daily for 2 days 09/24/20  Yes Isla Pence, MD  carbamazepine (TEGRETOL XR) 200 MG 12 hr tablet TAKE 1 TABLET BY MOUTH 2 TIMES DAILY 04/12/20 04/12/21  Eino Farber, PA-C  carbamazepine (TEGRETOL XR) 200 MG 12 hr tablet TAKE 1 TABLET BY MOUTH 2 TIMES DAILY 12/29/19 12/28/20  Eino Farber, PA-C  carbamazepine (TEGRETOL XR) 200 MG 12 hr tablet TAKE 1 TABLET BY MOUTH TWO TIMES DAILY 10/25/19 10/24/20  Eino Farber, PA-C  carbamazepine (TEGRETOL XR) 200 MG 12 hr tablet Take one tablet by mouth twice daily. 06/27/20     carbamazepine (TEGRETOL) 200 MG tablet Take 1 tablet (200 mg total) by mouth 2 (two)  times daily after a meal. 05/18/16   Patrecia Pour, NP  Dextromethorphan-quiNIDine (NUEDEXTA) 20-10 MG capsule TAKE 1 CAPSULE BY MOUTH 2 TIMES DAILY 12/29/19 12/28/20  Eino Farber, PA-C  Dextromethorphan-quiNIDine (NUEDEXTA) 20-10 MG capsule TAKE 1 CAPSULE BY MOUTH 2 TIMES DAILY 12/28/19 12/27/20  Eino Farber, PA-C  Dextromethorphan-quiNIDine (NUEDEXTA) 20-10 MG capsule TAKE 1 CAPSULE BY MOUTH TWO TIMES DAILY 10/25/19 10/24/20  Eino Farber, PA-C  Dextromethorphan-quiNIDine (NUEDEXTA) 20-10 MG capsule Take one capsule twice daily. 06/27/20     losartan (COZAAR) 100 MG tablet Take 100 mg by mouth daily. 05/24/19   [provider]  losartan (COZAAR) 100 MG tablet TAKE 1 TABLET BY MOUTH ONCE A DAY 01/31/20 01/30/21  Shirline Frees, MD  neomycin-polymyxin-hydrocortisone (CORTISPORIN) OTIC solution Apply 1-2 drops to toe after soaking twice a day Patient not  taking: Reported on 06/29/2019 06/07/19   Wallene Huh, DPM  neomycin-polymyxin-hydrocortisone (CORTISPORIN) OTIC solution Apply 1-2 drops to toe after soaking twice a day Patient not taking: Reported on 06/29/2019 06/29/19   Wallene Huh, DPM  NUEDEXTA 20-10 MG CAPS Take 1 capsule by mouth 2 (two) times daily. 04/29/16   [provider]  pantoprazole (PROTONIX) 40 MG tablet Take 40 mg by mouth every morning. 04/30/16   [provider]  pantoprazole (PROTONIX) 40 MG tablet TAKE 1 TABLET BY MOUTH 2 TIMES DAILY 03/11/20 03/11/21  Shirline Frees, MD  pantoprazole (PROTONIX) 40 MG tablet TAKE 1 TABLET BY MOUTH TWICE DAILY 09/22/19 09/21/20  Shirline Frees, MD  pantoprazole (PROTONIX) 40 MG tablet TAKE 1 TABLET BY MOUTH TWICE DAILY 03/22/20 03/22/21  Shirline Frees, MD  pantoprazole (PROTONIX) 40 MG tablet Take 1 tablet by mouth twice daily 09/24/20     QUEtiapine (SEROQUEL) 200 MG tablet Take 1 tablet (200 mg total) by mouth at bedtime. 05/18/16   Patrecia Pour, NP  QUEtiapine (SEROQUEL) 400 MG tablet TAKE 1 TABLET BY MOUTH AT BEDTIME 12/29/19 12/28/20  Eino Farber, PA-C  QUEtiapine (SEROQUEL) 400 MG tablet TAKE 1 TABLET BY MOUTH AT BEDTIME 12/25/19 12/24/20  Eino Farber, PA-C  QUEtiapine (SEROQUEL) 400 MG tablet TAKE 1 TABLET BY MOUTH AT BEDTIME 10/25/19 10/24/20  Eino Farber, PA-C  QUEtiapine (SEROQUEL) 400 MG tablet Take one tablet by mouth at bedtime. 06/27/20     QUEtiapine (SEROQUEL) 50 MG tablet Take 50-100 mg by mouth at bedtime as needed (sleep).  04/27/16   [provider]    Allergies    Oxycodone  Review of Systems   Review of Systems  Respiratory:  Positive for cough.   Gastrointestinal:  Positive for diarrhea and vomiting.  All other systems reviewed and are negative.  Physical Exam Updated Vital Signs BP 130/90   Pulse 73   Temp (!) 100.5 F (38.1 C) (Oral)   Resp 16   Ht '5\' 11"'$  (1.803 m)   Wt 106.1 kg   SpO2 97%   BMI 32.64 kg/m   Physical Exam Vitals and  nursing note reviewed.  Constitutional:      Appearance: Normal appearance.  HENT:     Head: Normocephalic and atraumatic.     Right Ear: External ear normal.     Left Ear: External ear normal.     Nose: Nose normal.     Mouth/Throat:     Mouth: Mucous membranes are dry.  Eyes:     Extraocular Movements: Extraocular movements intact.     Conjunctiva/sclera: Conjunctivae normal.     Pupils: Pupils are equal, round, and reactive  to light.  Cardiovascular:     Rate and Rhythm: Normal rate and regular rhythm.     Pulses: Normal pulses.     Heart sounds: Normal heart sounds.  Pulmonary:     Effort: Pulmonary effort is normal.     Breath sounds: Wheezing present.  Abdominal:     General: Abdomen is flat. Bowel sounds are normal.     Palpations: Abdomen is soft.  Musculoskeletal:        General: Normal range of motion.     Cervical back: Normal range of motion and neck supple.  Skin:    General: Skin is warm.     Capillary Refill: Capillary refill takes less than 2 seconds.  Neurological:     General: No focal deficit present.     Mental Status: He is alert and oriented to person, place, and time.  Psychiatric:        Mood and Affect: Mood normal.        Behavior: Behavior normal.    ED Results / Procedures / Treatments   Labs (all labs ordered are listed, but only abnormal results are displayed) Labs Reviewed  COMPREHENSIVE METABOLIC PANEL  CBC WITH DIFFERENTIAL/PLATELET    EKG None  Radiology DG Chest Portable 1 View  Result Date: 09/24/2020 CLINICAL DATA:  COVID, shortness of breath. EXAM: PORTABLE CHEST 1 VIEW COMPARISON:  None. FINDINGS: The right costophrenic angle has been excluded from the examination. There is no focal lung infiltrate or pneumothorax. There is no definite pleural effusion. The cardiomediastinal silhouette is within normal limits. No acute fractures are seen. IMPRESSION: No active disease. Electronically Signed   By: Ronney Asters M.D.   On:  09/24/2020 20:26    Procedures Procedures   Medications Ordered in ED Medications  acetaminophen (TYLENOL) tablet 650 mg (650 mg Oral Given 09/24/20 1810)  sodium chloride 0.9 % bolus 1,000 mL (1,000 mLs Intravenous New Bag/Given 09/24/20 2024)  ondansetron (ZOFRAN) injection 4 mg (4 mg Intravenous Given 09/24/20 2022)  albuterol (VENTOLIN HFA) 108 (90 Base) MCG/ACT inhaler 2 puff (2 puffs Inhalation Given 09/24/20 2025)  aerochamber Z-Stat Plus/medium 1 each (1 each Other Given 09/24/20 2026)  dexamethasone (DECADRON) injection 10 mg (10 mg Intravenous Given 09/24/20 2022)  ketorolac (TORADOL) 30 MG/ML injection 30 mg (30 mg Intravenous Given 09/24/20 2029)    ED Course  I have reviewed the triage vital signs and the nursing notes.  Pertinent labs & imaging results that were available during my care of the patient were reviewed by me and considered in my medical decision making (see chart for details).    MDM Rules/Calculators/A&P                          Pt is on his 5th day of sx, so he can't take Paxlovid.  Pt is feeling much better after fluids and meds.  He is able to tolerate po fluids.    He does smoke and is encouraged to not smoke.  He is to return if worse.  F/u with pcp.  Wesley Elliott was evaluated in Emergency Department on 09/24/2020 for the symptoms described in the history of present illness. He was evaluated in the context of the global COVID-19 pandemic, which necessitated consideration that the patient might be at risk for infection with the SARS-CoV-2 virus that causes COVID-19. Institutional protocols and algorithms that pertain to the evaluation of patients at risk for COVID-19 are in a state  of rapid change based on information released by regulatory bodies including the CDC and federal and state organizations. These policies and algorithms were followed during the patient's care in the ED.  Final Clinical Impression(s) / ED Diagnoses Final diagnoses:  Dehydration   Diarrhea, unspecified type  Non-intractable vomiting with nausea, unspecified vomiting type  COVID-19  Tobacco abuse    Rx / DC Orders ED Discharge Orders          Ordered    ondansetron (ZOFRAN ODT) 4 MG disintegrating tablet  Every 8 hours PRN        09/24/20 2133    ibuprofen (ADVIL) 600 MG tablet  Every 6 hours PRN        09/24/20 2133    predniSONE (STERAPRED UNI-PAK 21 TAB) 10 MG (21) TBPK tablet  Daily        09/24/20 2133             Isla Pence, MD 09/24/20 2135

## 2020-09-24 NOTE — Discharge Instructions (Addendum)
Person Under Monitoring Name: Wesley Elliott  Location: [address] [place] [address]   Infection Prevention Recommendations for Individuals Confirmed to have, or Being Evaluated for, 2019 Novel Coronavirus (COVID-19) Infection Who Receive Care at Home  Individuals who are confirmed to have, or are being evaluated for, COVID-19 should follow the prevention steps below until a healthcare provider or local or state health department says they can return to normal activities.  Stay home except to get medical care You should restrict activities outside your home, except for getting medical care. Do not go to work, school, or public areas, and do not use public transportation or taxis.  Call ahead before visiting your doctor Before your medical appointment, call the healthcare provider and tell them that you have, or are being evaluated for, COVID-19 infection. This will help the healthcare provider's office take steps to keep other people from getting infected. Ask your healthcare provider to call the local or state health department.  Monitor your symptoms Seek prompt medical attention if your illness is worsening (e.g., difficulty breathing). Before going to your medical appointment, call the healthcare provider and tell them that you have, or are being evaluated for, COVID-19 infection. Ask your healthcare provider to call the local or state health department.  Wear a facemask You should wear a facemask that covers your nose and mouth when you are in the same room with other people and when you visit a healthcare provider. People who live with or visit you should also wear a facemask while they are in the same room with you.  Separate yourself from other people in your home As much as possible, you should stay in a different room from other people in your home. Also, you should use a separate bathroom, if available.  Avoid sharing household items You should  not share dishes, drinking glasses, cups, eating utensils, towels, bedding, or other items with other people in your home. After using these items, you should wash them thoroughly with soap and water.  Cover your coughs and sneezes Cover your mouth and nose with a tissue when you cough or sneeze, or you can cough or sneeze into your sleeve. Throw used tissues in a lined trash can, and immediately wash your hands with soap and water for at least 20 seconds or use an alcohol-based hand rub.  Wash your [facility] your hands often and thoroughly with soap and water for at least 20 seconds. You can use an alcohol-based hand sanitizer if soap and water are not available and if your hands are not visibly dirty. Avoid touching your eyes, nose, and mouth with unwashed hands.   Prevention Steps for Caregivers and Household Members of Individuals Confirmed to have, or Being Evaluated for, COVID-19 Infection Being Cared for in the Home  If you live with, or provide care at home for, a person confirmed to have, or being evaluated for, COVID-19 infection please follow these guidelines to prevent infection:  Follow healthcare provider's instructions Make sure that you understand and can help the patient follow any healthcare provider instructions for all care.  Provide for the patient's basic needs You should help the patient with basic needs in the home and provide support for getting groceries, prescriptions, and other personal needs.  Monitor the patient's symptoms If they are getting sicker, call his or her medical provider and tell them that the patient has, or is being evaluated for, COVID-19 infection. This will help the healthcare provider's office  take steps to keep other people from getting infected. Ask the healthcare provider to call the local or state health department.  Limit the number of people who have contact with the patient If possible, have only one caregiver for the  patient. Other household members should stay in another home or place of residence. If this is not possible, they should stay in another room, or be separated from the patient as much as possible. Use a separate bathroom, if available. Restrict visitors who do not have an essential need to be in the home.  Keep older adults, very young children, and other sick people away from the patient Keep older adults, very young children, and those who have compromised immune systems or chronic health conditions away from the patient. This includes people with chronic heart, lung, or kidney conditions, diabetes, and cancer.  Ensure good ventilation Make sure that shared spaces in the home have good air flow, such as from an air conditioner or an opened window, weather permitting.  Wash your hands often Wash your hands often and thoroughly with soap and water for at least 20 seconds. You can use an alcohol based hand sanitizer if soap and water are not available and if your hands are not visibly dirty. Avoid touching your eyes, nose, and mouth with unwashed hands. Use disposable paper towels to dry your hands. If not available, use dedicated cloth towels and replace them when they become wet.  Wear a facemask and gloves Wear a disposable facemask at all times in the room and gloves when you touch or have contact with the patient's blood, body fluids, and/or secretions or excretions, such as sweat, saliva, sputum, nasal mucus, vomit, urine, or feces.  Ensure the mask fits over your nose and mouth tightly, and do not touch it during use. Throw out disposable facemasks and gloves after using them. Do not reuse. Wash your hands immediately after removing your facemask and gloves. If your personal clothing becomes contaminated, carefully remove clothing and launder. Wash your hands after handling contaminated clothing. Place all used disposable facemasks, gloves, and other waste in a lined container before  disposing them with other household waste. Remove gloves and wash your hands immediately after handling these items.  Do not share dishes, glasses, or other household items with the patient Avoid sharing household items. You should not share dishes, drinking glasses, cups, eating utensils, towels, bedding, or other items with a patient who is confirmed to have, or being evaluated for, COVID-19 infection. After the person uses these items, you should wash them thoroughly with soap and water.  Wash laundry thoroughly Immediately remove and wash clothes or bedding that have blood, body fluids, and/or secretions or excretions, such as sweat, saliva, sputum, nasal mucus, vomit, urine, or feces, on them. Wear gloves when handling laundry from the patient. Read and follow directions on labels of laundry or clothing items and detergent. In general, wash and dry with the warmest temperatures recommended on the label.  Clean all areas the individual has used often Clean all touchable surfaces, such as counters, tabletops, doorknobs, bathroom fixtures, toilets, phones, keyboards, tablets, and bedside tables, every day. Also, clean any surfaces that may have blood, body fluids, and/or secretions or excretions on them. Wear gloves when cleaning surfaces the patient has come in contact with. Use a diluted bleach solution (e.g., dilute bleach with 1 part bleach and 10 parts water) or a household disinfectant with a label that says EPA-registered for coronaviruses. To make a bleach  solution at home, add 1 tablespoon of bleach to 1 quart (4 cups) of water. For a larger supply, add  cup of bleach to 1 gallon (16 cups) of water. Read labels of cleaning products and follow recommendations provided on product labels. Labels contain instructions for safe and effective use of the cleaning product including precautions you should take when applying the product, such as wearing gloves or eye protection and making sure you  have good ventilation during use of the product. Remove gloves and wash hands immediately after cleaning.  Monitor yourself for signs and symptoms of illness Caregivers and household members are considered close contacts, should monitor their health, and will be asked to limit movement outside of the home to the extent possible. Follow the monitoring steps for close contacts listed on the symptom monitoring form.   ? If you have additional questions, contact your local health department or call the epidemiologist on call at [phone] (available 24/7). ? This guidance is subject to change. For the most up-to-date guidance from [facility], please refer to their website: [email or web address]

## 2020-09-24 NOTE — ED Triage Notes (Signed)
Patient c/o vomiting and diarrhea x 2 days. Patient stated he took a home test today and was covid positive.

## 2020-09-24 NOTE — ED Provider Notes (Signed)
Emergency Medicine Provider Triage Evaluation Note  Wesley Elliott , a 40 y.o. male  was evaluated in triage.  Pt complains of cough, fatigue, fevers, nausea, vomiting, diarrhea that started about 3 days ago.  Patient had a positive home COVID-19 test.  Denies any chest pain, shortness of breath, abdominal pain.  States he came to the emergency department for further evaluation as well as a prescription for an antiviral medication.  States he has been vaccinated for COVID-19 x2 and denies any previous history of known COVID-19 infection.  Physical Exam  BP (!) 148/94 (BP Location: Right Arm)   Pulse 93   Temp (!) 100.5 F (38.1 C) (Oral)   Resp 18   SpO2 98%  Gen:   Awake, no distress   Resp:  Normal effort  MSK:   Moves extremities without difficulty  Other:    Medical Decision Making  Medically screening exam initiated at 5:57 PM.  Appropriate orders placed.  Wesley Elliott was informed that the remainder of the evaluation will be completed by another provider, this initial triage assessment does not replace that evaluation, and the importance of remaining in the ED until their evaluation is complete.   Rayna Sexton, PA-C 09/24/20 1758    Hayden Rasmussen, MD 09/25/20 1026

## 2020-09-25 ENCOUNTER — Other Ambulatory Visit (HOSPITAL_COMMUNITY): Payer: Self-pay

## 2020-09-26 ENCOUNTER — Other Ambulatory Visit (HOSPITAL_COMMUNITY): Payer: Self-pay

## 2020-09-26 MED ORDER — CARBAMAZEPINE ER 200 MG PO TB12
200.0000 mg | ORAL_TABLET | Freq: Two times a day (BID) | ORAL | 2 refills | Status: DC
  Filled 2020-09-26: qty 60, 30d supply, fill #0
  Filled 2020-10-25: qty 60, 30d supply, fill #1
  Filled 2020-11-17: qty 60, 30d supply, fill #2

## 2020-09-26 MED ORDER — QUETIAPINE FUMARATE 400 MG PO TABS
ORAL_TABLET | ORAL | 2 refills | Status: AC
  Filled 2020-09-27: qty 30, 30d supply, fill #0
  Filled 2020-10-25: qty 30, 30d supply, fill #1
  Filled 2020-11-26: qty 30, 30d supply, fill #2

## 2020-09-26 MED ORDER — NUEDEXTA 20-10 MG PO CAPS
ORAL_CAPSULE | ORAL | 2 refills | Status: DC
  Filled 2020-09-26 – 2020-10-24 (×2): qty 60, 30d supply, fill #0
  Filled 2020-11-26: qty 60, 30d supply, fill #1
  Filled 2020-12-21: qty 60, 30d supply, fill #2

## 2020-09-27 ENCOUNTER — Other Ambulatory Visit (HOSPITAL_COMMUNITY): Payer: Self-pay

## 2020-10-02 ENCOUNTER — Other Ambulatory Visit (HOSPITAL_COMMUNITY): Payer: Self-pay

## 2020-10-24 ENCOUNTER — Other Ambulatory Visit (HOSPITAL_COMMUNITY): Payer: Self-pay

## 2020-10-24 DIAGNOSIS — M25511 Pain in right shoulder: Secondary | ICD-10-CM | POA: Diagnosis not present

## 2020-10-24 DIAGNOSIS — Z23 Encounter for immunization: Secondary | ICD-10-CM | POA: Diagnosis not present

## 2020-10-24 DIAGNOSIS — Z720 Tobacco use: Secondary | ICD-10-CM | POA: Diagnosis not present

## 2020-10-25 MED FILL — Losartan Potassium Tab 100 MG: ORAL | 90 days supply | Qty: 90 | Fill #2 | Status: AC

## 2020-10-26 ENCOUNTER — Other Ambulatory Visit (HOSPITAL_COMMUNITY): Payer: Self-pay

## 2020-10-28 ENCOUNTER — Other Ambulatory Visit (HOSPITAL_COMMUNITY): Payer: Self-pay

## 2020-10-29 ENCOUNTER — Other Ambulatory Visit (HOSPITAL_COMMUNITY): Payer: Self-pay

## 2020-10-30 ENCOUNTER — Other Ambulatory Visit (HOSPITAL_COMMUNITY): Payer: Self-pay

## 2020-11-01 DIAGNOSIS — M25311 Other instability, right shoulder: Secondary | ICD-10-CM | POA: Diagnosis not present

## 2020-11-18 ENCOUNTER — Other Ambulatory Visit (HOSPITAL_COMMUNITY): Payer: Self-pay

## 2020-11-20 ENCOUNTER — Other Ambulatory Visit (HOSPITAL_COMMUNITY): Payer: Self-pay

## 2020-11-26 ENCOUNTER — Other Ambulatory Visit (HOSPITAL_COMMUNITY): Payer: Self-pay

## 2020-11-27 ENCOUNTER — Other Ambulatory Visit (HOSPITAL_COMMUNITY): Payer: Self-pay

## 2020-11-29 DIAGNOSIS — M24111 Other articular cartilage disorders, right shoulder: Secondary | ICD-10-CM | POA: Diagnosis not present

## 2020-11-29 DIAGNOSIS — S43431A Superior glenoid labrum lesion of right shoulder, initial encounter: Secondary | ICD-10-CM | POA: Diagnosis not present

## 2020-11-29 DIAGNOSIS — M7541 Impingement syndrome of right shoulder: Secondary | ICD-10-CM | POA: Diagnosis not present

## 2020-11-29 DIAGNOSIS — G8918 Other acute postprocedural pain: Secondary | ICD-10-CM | POA: Diagnosis not present

## 2020-11-29 DIAGNOSIS — M19011 Primary osteoarthritis, right shoulder: Secondary | ICD-10-CM | POA: Diagnosis not present

## 2020-12-11 DIAGNOSIS — M25511 Pain in right shoulder: Secondary | ICD-10-CM | POA: Diagnosis not present

## 2020-12-21 ENCOUNTER — Other Ambulatory Visit (HOSPITAL_COMMUNITY): Payer: Self-pay

## 2020-12-23 ENCOUNTER — Other Ambulatory Visit (HOSPITAL_COMMUNITY): Payer: Self-pay

## 2020-12-23 MED ORDER — QUETIAPINE FUMARATE 400 MG PO TABS
ORAL_TABLET | ORAL | 0 refills | Status: AC
  Filled 2020-12-23: qty 30, 30d supply, fill #0

## 2020-12-23 MED ORDER — CARBAMAZEPINE ER 200 MG PO TB12
200.0000 mg | ORAL_TABLET | Freq: Two times a day (BID) | ORAL | 0 refills | Status: AC
  Filled 2020-12-23: qty 60, 30d supply, fill #0

## 2020-12-24 ENCOUNTER — Other Ambulatory Visit (HOSPITAL_COMMUNITY): Payer: Self-pay

## 2021-01-19 ENCOUNTER — Other Ambulatory Visit (HOSPITAL_COMMUNITY): Payer: Self-pay

## 2021-01-21 ENCOUNTER — Other Ambulatory Visit (HOSPITAL_COMMUNITY): Payer: Self-pay

## 2021-01-21 MED ORDER — LOSARTAN POTASSIUM 100 MG PO TABS
ORAL_TABLET | ORAL | 2 refills | Status: DC
  Filled 2021-01-21: qty 90, 90d supply, fill #0
  Filled 2021-04-28: qty 90, 90d supply, fill #1
  Filled 2021-07-28: qty 90, 90d supply, fill #2

## 2021-01-21 MED ORDER — NUEDEXTA 20-10 MG PO CAPS
ORAL_CAPSULE | ORAL | 0 refills | Status: AC
  Filled 2021-01-21: qty 60, 30d supply, fill #0

## 2021-01-21 MED ORDER — QUETIAPINE FUMARATE 400 MG PO TABS
400.0000 mg | ORAL_TABLET | Freq: Every evening | ORAL | 0 refills | Status: DC
  Filled 2021-01-21: qty 30, 30d supply, fill #0

## 2021-01-22 ENCOUNTER — Other Ambulatory Visit (HOSPITAL_COMMUNITY): Payer: Self-pay

## 2021-01-23 ENCOUNTER — Other Ambulatory Visit (HOSPITAL_COMMUNITY): Payer: Self-pay

## 2021-02-01 ENCOUNTER — Other Ambulatory Visit (HOSPITAL_COMMUNITY): Payer: Self-pay

## 2021-02-03 ENCOUNTER — Other Ambulatory Visit (HOSPITAL_COMMUNITY): Payer: Self-pay

## 2021-02-03 MED ORDER — CARBAMAZEPINE ER 200 MG PO TB12: 200.0000 mg | ORAL_TABLET | Freq: Two times a day (BID) | ORAL | 0 refills | Status: AC

## 2021-02-05 ENCOUNTER — Other Ambulatory Visit (HOSPITAL_COMMUNITY): Payer: Self-pay

## 2021-02-20 ENCOUNTER — Other Ambulatory Visit (HOSPITAL_COMMUNITY): Payer: Self-pay

## 2021-02-20 MED ORDER — NUEDEXTA 20-10 MG PO CAPS
ORAL_CAPSULE | ORAL | 0 refills | Status: DC
  Filled 2021-02-20: qty 60, 30d supply, fill #0

## 2021-02-21 ENCOUNTER — Other Ambulatory Visit (HOSPITAL_COMMUNITY): Payer: Self-pay

## 2021-02-22 ENCOUNTER — Other Ambulatory Visit (HOSPITAL_COMMUNITY): Payer: Self-pay

## 2021-02-26 ENCOUNTER — Other Ambulatory Visit (HOSPITAL_COMMUNITY): Payer: Self-pay

## 2021-02-26 MED ORDER — QUETIAPINE FUMARATE 400 MG PO TABS
ORAL_TABLET | ORAL | 0 refills | Status: DC
  Filled 2021-02-26: qty 30, 30d supply, fill #0

## 2021-03-03 ENCOUNTER — Other Ambulatory Visit (HOSPITAL_COMMUNITY): Payer: Self-pay

## 2021-03-03 MED ORDER — CARBAMAZEPINE ER 200 MG PO TB12
200.0000 mg | ORAL_TABLET | Freq: Two times a day (BID) | ORAL | 3 refills | Status: AC
  Filled 2021-03-03: qty 60, 30d supply, fill #0
  Filled 2021-03-25: qty 60, 30d supply, fill #1
  Filled 2021-04-18: qty 60, 30d supply, fill #2
  Filled 2021-05-28 – 2021-06-03 (×2): qty 60, 30d supply, fill #3

## 2021-03-03 MED ORDER — NUEDEXTA 20-10 MG PO CAPS
ORAL_CAPSULE | ORAL | 3 refills | Status: DC
  Filled 2021-03-03 – 2021-03-25 (×2): qty 60, 30d supply, fill #0
  Filled 2021-04-24: qty 60, 30d supply, fill #1
  Filled 2021-05-14: qty 60, 30d supply, fill #2
  Filled 2021-06-15: qty 60, 30d supply, fill #3

## 2021-03-03 MED ORDER — QUETIAPINE FUMARATE 400 MG PO TABS
400.0000 mg | ORAL_TABLET | Freq: Every evening | ORAL | 3 refills | Status: DC
  Filled 2021-03-03 – 2021-03-15 (×2): qty 30, 30d supply, fill #0
  Filled 2021-04-18: qty 30, 30d supply, fill #1
  Filled 2021-05-14: qty 30, 30d supply, fill #2
  Filled 2021-06-15: qty 30, 30d supply, fill #3

## 2021-03-04 ENCOUNTER — Other Ambulatory Visit (HOSPITAL_COMMUNITY): Payer: Self-pay

## 2021-03-15 ENCOUNTER — Other Ambulatory Visit (HOSPITAL_COMMUNITY): Payer: Self-pay

## 2021-03-24 ENCOUNTER — Other Ambulatory Visit (HOSPITAL_COMMUNITY): Payer: Self-pay

## 2021-03-25 ENCOUNTER — Other Ambulatory Visit (HOSPITAL_COMMUNITY): Payer: Self-pay

## 2021-03-26 ENCOUNTER — Other Ambulatory Visit (HOSPITAL_COMMUNITY): Payer: Self-pay

## 2021-03-27 ENCOUNTER — Other Ambulatory Visit (HOSPITAL_COMMUNITY): Payer: Self-pay

## 2021-03-31 ENCOUNTER — Other Ambulatory Visit (HOSPITAL_COMMUNITY): Payer: Self-pay

## 2021-04-01 DIAGNOSIS — K219 Gastro-esophageal reflux disease without esophagitis: Secondary | ICD-10-CM | POA: Diagnosis not present

## 2021-04-01 DIAGNOSIS — E7800 Pure hypercholesterolemia, unspecified: Secondary | ICD-10-CM | POA: Diagnosis not present

## 2021-04-01 DIAGNOSIS — Z87820 Personal history of traumatic brain injury: Secondary | ICD-10-CM | POA: Diagnosis not present

## 2021-04-01 DIAGNOSIS — F3177 Bipolar disorder, in partial remission, most recent episode mixed: Secondary | ICD-10-CM | POA: Diagnosis not present

## 2021-04-01 DIAGNOSIS — F17200 Nicotine dependence, unspecified, uncomplicated: Secondary | ICD-10-CM | POA: Diagnosis not present

## 2021-04-01 DIAGNOSIS — R7301 Impaired fasting glucose: Secondary | ICD-10-CM | POA: Diagnosis not present

## 2021-04-01 DIAGNOSIS — I10 Essential (primary) hypertension: Secondary | ICD-10-CM | POA: Diagnosis not present

## 2021-04-24 ENCOUNTER — Other Ambulatory Visit (HOSPITAL_COMMUNITY): Payer: Self-pay

## 2021-04-25 ENCOUNTER — Other Ambulatory Visit (HOSPITAL_COMMUNITY): Payer: Self-pay

## 2021-04-28 ENCOUNTER — Other Ambulatory Visit (HOSPITAL_COMMUNITY): Payer: Self-pay

## 2021-05-14 ENCOUNTER — Other Ambulatory Visit (HOSPITAL_COMMUNITY): Payer: Self-pay

## 2021-05-28 ENCOUNTER — Other Ambulatory Visit (HOSPITAL_COMMUNITY): Payer: Self-pay

## 2021-06-02 ENCOUNTER — Other Ambulatory Visit (HOSPITAL_COMMUNITY): Payer: Self-pay

## 2021-06-04 ENCOUNTER — Other Ambulatory Visit (HOSPITAL_COMMUNITY): Payer: Self-pay

## 2021-06-05 ENCOUNTER — Other Ambulatory Visit (HOSPITAL_COMMUNITY): Payer: Self-pay

## 2021-06-15 ENCOUNTER — Other Ambulatory Visit (HOSPITAL_COMMUNITY): Payer: Self-pay

## 2021-06-17 ENCOUNTER — Other Ambulatory Visit (HOSPITAL_COMMUNITY): Payer: Self-pay

## 2021-06-17 MED ORDER — PANTOPRAZOLE SODIUM 40 MG PO TBEC
40.0000 mg | DELAYED_RELEASE_TABLET | Freq: Two times a day (BID) | ORAL | 0 refills | Status: DC
  Filled 2021-06-17: qty 180, 90d supply, fill #0

## 2021-06-18 ENCOUNTER — Other Ambulatory Visit (HOSPITAL_COMMUNITY): Payer: Self-pay

## 2021-06-23 ENCOUNTER — Other Ambulatory Visit (HOSPITAL_COMMUNITY): Payer: Self-pay

## 2021-06-30 ENCOUNTER — Other Ambulatory Visit (HOSPITAL_COMMUNITY): Payer: Self-pay

## 2021-06-30 MED ORDER — QUETIAPINE FUMARATE 400 MG PO TABS
400.0000 mg | ORAL_TABLET | Freq: Every evening | ORAL | 3 refills | Status: DC
  Filled 2021-06-30 – 2021-07-16 (×2): qty 30, 30d supply, fill #0
  Filled 2021-08-21: qty 30, 30d supply, fill #1
  Filled 2021-09-20: qty 30, 30d supply, fill #2
  Filled 2021-10-14 – 2021-10-23 (×2): qty 30, 30d supply, fill #3

## 2021-06-30 MED ORDER — NUEDEXTA 20-10 MG PO CAPS
1.0000 | ORAL_CAPSULE | Freq: Two times a day (BID) | ORAL | 3 refills | Status: AC
  Filled 2021-06-30 – 2021-07-23 (×2): qty 60, 30d supply, fill #0
  Filled 2021-07-28 – 2021-07-31 (×2): qty 60, 30d supply, fill #1
  Filled 2021-08-01: qty 10, 5d supply, fill #1
  Filled 2021-08-01: qty 60, 30d supply, fill #1
  Filled 2021-08-01: qty 50, 25d supply, fill #1
  Filled 2021-09-02: qty 60, 30d supply, fill #2
  Filled 2021-10-28: qty 60, 30d supply, fill #3

## 2021-06-30 MED ORDER — CARBAMAZEPINE ER 200 MG PO TB12
200.0000 mg | ORAL_TABLET | Freq: Two times a day (BID) | ORAL | 3 refills | Status: AC
  Filled 2021-06-30: qty 60, 30d supply, fill #0
  Filled 2021-07-28: qty 60, 30d supply, fill #1
  Filled 2021-09-02: qty 60, 30d supply, fill #2
  Filled 2021-09-20 – 2021-10-14 (×2): qty 60, 30d supply, fill #3

## 2021-07-01 ENCOUNTER — Other Ambulatory Visit (HOSPITAL_COMMUNITY): Payer: Self-pay

## 2021-07-16 ENCOUNTER — Other Ambulatory Visit (HOSPITAL_COMMUNITY): Payer: Self-pay

## 2021-07-17 ENCOUNTER — Other Ambulatory Visit (HOSPITAL_COMMUNITY): Payer: Self-pay

## 2021-07-23 ENCOUNTER — Other Ambulatory Visit (HOSPITAL_COMMUNITY): Payer: Self-pay

## 2021-07-24 ENCOUNTER — Other Ambulatory Visit (HOSPITAL_COMMUNITY): Payer: Self-pay

## 2021-07-28 ENCOUNTER — Other Ambulatory Visit (HOSPITAL_COMMUNITY): Payer: Self-pay

## 2021-07-31 ENCOUNTER — Other Ambulatory Visit (HOSPITAL_COMMUNITY): Payer: Self-pay

## 2021-08-01 ENCOUNTER — Other Ambulatory Visit (HOSPITAL_COMMUNITY): Payer: Self-pay

## 2021-08-04 ENCOUNTER — Other Ambulatory Visit (HOSPITAL_COMMUNITY): Payer: Self-pay

## 2021-08-06 ENCOUNTER — Other Ambulatory Visit (HOSPITAL_COMMUNITY): Payer: Self-pay

## 2021-08-11 ENCOUNTER — Other Ambulatory Visit (HOSPITAL_COMMUNITY): Payer: Self-pay

## 2021-08-21 ENCOUNTER — Other Ambulatory Visit (HOSPITAL_COMMUNITY): Payer: Self-pay

## 2021-08-25 ENCOUNTER — Other Ambulatory Visit (HOSPITAL_COMMUNITY): Payer: Self-pay

## 2021-09-04 ENCOUNTER — Other Ambulatory Visit (HOSPITAL_COMMUNITY): Payer: Self-pay

## 2021-09-08 ENCOUNTER — Other Ambulatory Visit (HOSPITAL_COMMUNITY): Payer: Self-pay

## 2021-09-20 ENCOUNTER — Other Ambulatory Visit (HOSPITAL_COMMUNITY): Payer: Self-pay

## 2021-09-29 ENCOUNTER — Other Ambulatory Visit (HOSPITAL_COMMUNITY): Payer: Self-pay

## 2021-10-07 ENCOUNTER — Other Ambulatory Visit (HOSPITAL_COMMUNITY): Payer: Self-pay

## 2021-10-09 ENCOUNTER — Other Ambulatory Visit (HOSPITAL_COMMUNITY): Payer: Self-pay

## 2021-10-09 MED ORDER — PANTOPRAZOLE SODIUM 40 MG PO TBEC
40.0000 mg | DELAYED_RELEASE_TABLET | Freq: Two times a day (BID) | ORAL | 0 refills | Status: DC
  Filled 2021-10-09: qty 180, 90d supply, fill #0

## 2021-10-14 ENCOUNTER — Other Ambulatory Visit (HOSPITAL_COMMUNITY): Payer: Self-pay

## 2021-10-17 ENCOUNTER — Other Ambulatory Visit (HOSPITAL_COMMUNITY): Payer: Self-pay

## 2021-10-24 ENCOUNTER — Other Ambulatory Visit (HOSPITAL_COMMUNITY): Payer: Self-pay

## 2021-10-27 ENCOUNTER — Other Ambulatory Visit (HOSPITAL_COMMUNITY): Payer: Self-pay

## 2021-10-27 MED ORDER — QUETIAPINE FUMARATE 400 MG PO TABS
400.0000 mg | ORAL_TABLET | Freq: Every day | ORAL | 3 refills | Status: AC
  Filled 2021-10-27 – 2021-11-24 (×2): qty 30, 30d supply, fill #0
  Filled 2021-12-16 – 2021-12-18 (×2): qty 30, 30d supply, fill #1
  Filled 2022-01-21: qty 30, 30d supply, fill #2
  Filled 2022-02-19: qty 30, 30d supply, fill #3

## 2021-10-27 MED ORDER — CARBAMAZEPINE ER 200 MG PO TB12
200.0000 mg | ORAL_TABLET | Freq: Two times a day (BID) | ORAL | 3 refills | Status: DC
  Filled 2021-10-27: qty 60, 30d supply, fill #0
  Filled 2021-12-01: qty 28, 14d supply, fill #0
  Filled 2021-12-01: qty 34, 17d supply, fill #0
  Filled 2021-12-01: qty 32, 16d supply, fill #0
  Filled 2021-12-01: qty 34, 17d supply, fill #0
  Filled 2021-12-21 – 2021-12-27 (×2): qty 60, 30d supply, fill #1
  Filled 2022-01-21: qty 60, 30d supply, fill #2
  Filled 2022-03-03: qty 60, 30d supply, fill #3

## 2021-10-27 MED ORDER — NUEDEXTA 20-10 MG PO CAPS
1.0000 | ORAL_CAPSULE | Freq: Two times a day (BID) | ORAL | 3 refills | Status: DC
  Filled 2021-10-27: qty 60, 30d supply, fill #0
  Filled 2021-11-30: qty 60, 30d supply, fill #1
  Filled 2021-12-27: qty 60, 30d supply, fill #2
  Filled 2022-01-27 – 2022-03-09 (×4): qty 60, 30d supply, fill #3

## 2021-10-31 ENCOUNTER — Other Ambulatory Visit (HOSPITAL_COMMUNITY): Payer: Self-pay

## 2021-11-04 ENCOUNTER — Other Ambulatory Visit (HOSPITAL_COMMUNITY): Payer: Self-pay

## 2021-11-04 DIAGNOSIS — K219 Gastro-esophageal reflux disease without esophagitis: Secondary | ICD-10-CM | POA: Diagnosis not present

## 2021-11-04 DIAGNOSIS — I10 Essential (primary) hypertension: Secondary | ICD-10-CM | POA: Diagnosis not present

## 2021-11-04 DIAGNOSIS — E7800 Pure hypercholesterolemia, unspecified: Secondary | ICD-10-CM | POA: Diagnosis not present

## 2021-11-04 DIAGNOSIS — F3177 Bipolar disorder, in partial remission, most recent episode mixed: Secondary | ICD-10-CM | POA: Diagnosis not present

## 2021-11-04 DIAGNOSIS — Z87820 Personal history of traumatic brain injury: Secondary | ICD-10-CM | POA: Diagnosis not present

## 2021-11-04 DIAGNOSIS — R7301 Impaired fasting glucose: Secondary | ICD-10-CM | POA: Diagnosis not present

## 2021-11-04 DIAGNOSIS — F17210 Nicotine dependence, cigarettes, uncomplicated: Secondary | ICD-10-CM | POA: Diagnosis not present

## 2021-11-05 ENCOUNTER — Other Ambulatory Visit (HOSPITAL_COMMUNITY): Payer: Self-pay

## 2021-11-05 MED ORDER — LOSARTAN POTASSIUM 100 MG PO TABS
100.0000 mg | ORAL_TABLET | Freq: Every day | ORAL | 0 refills | Status: DC
  Filled 2021-11-05: qty 90, 90d supply, fill #0

## 2021-11-05 MED ORDER — ATORVASTATIN CALCIUM 40 MG PO TABS
40.0000 mg | ORAL_TABLET | Freq: Every day | ORAL | 3 refills | Status: DC
  Filled 2021-11-05: qty 30, 30d supply, fill #0
  Filled 2021-11-30: qty 30, 30d supply, fill #1
  Filled 2021-12-27: qty 30, 30d supply, fill #2
  Filled 2022-01-21: qty 30, 30d supply, fill #3

## 2021-11-06 ENCOUNTER — Other Ambulatory Visit (HOSPITAL_COMMUNITY): Payer: Self-pay

## 2021-11-24 ENCOUNTER — Other Ambulatory Visit (HOSPITAL_COMMUNITY): Payer: Self-pay

## 2021-11-27 ENCOUNTER — Other Ambulatory Visit (HOSPITAL_COMMUNITY): Payer: Self-pay

## 2021-12-01 ENCOUNTER — Other Ambulatory Visit (HOSPITAL_COMMUNITY): Payer: Self-pay

## 2021-12-02 ENCOUNTER — Other Ambulatory Visit (HOSPITAL_COMMUNITY): Payer: Self-pay

## 2021-12-03 ENCOUNTER — Other Ambulatory Visit (HOSPITAL_COMMUNITY): Payer: Self-pay

## 2021-12-17 ENCOUNTER — Other Ambulatory Visit (HOSPITAL_COMMUNITY): Payer: Self-pay

## 2021-12-22 ENCOUNTER — Other Ambulatory Visit (HOSPITAL_COMMUNITY): Payer: Self-pay

## 2021-12-27 ENCOUNTER — Other Ambulatory Visit (HOSPITAL_COMMUNITY): Payer: Self-pay

## 2021-12-29 ENCOUNTER — Other Ambulatory Visit (HOSPITAL_COMMUNITY): Payer: Self-pay

## 2021-12-29 MED ORDER — PANTOPRAZOLE SODIUM 40 MG PO TBEC
40.0000 mg | DELAYED_RELEASE_TABLET | Freq: Two times a day (BID) | ORAL | 0 refills | Status: DC
  Filled 2021-12-29: qty 180, 90d supply, fill #0

## 2022-01-12 IMAGING — DX DG CHEST 1V PORT
1 series · 2 of 2 positions shown · non-contrast
Comparison: None.

CLINICAL DATA: COVID, shortness of breath.

EXAM:
PORTABLE CHEST 1 VIEW

[Series 1: chest ap · 0.14mm/px · 2 of 2 slices shown]
[im 1/2]
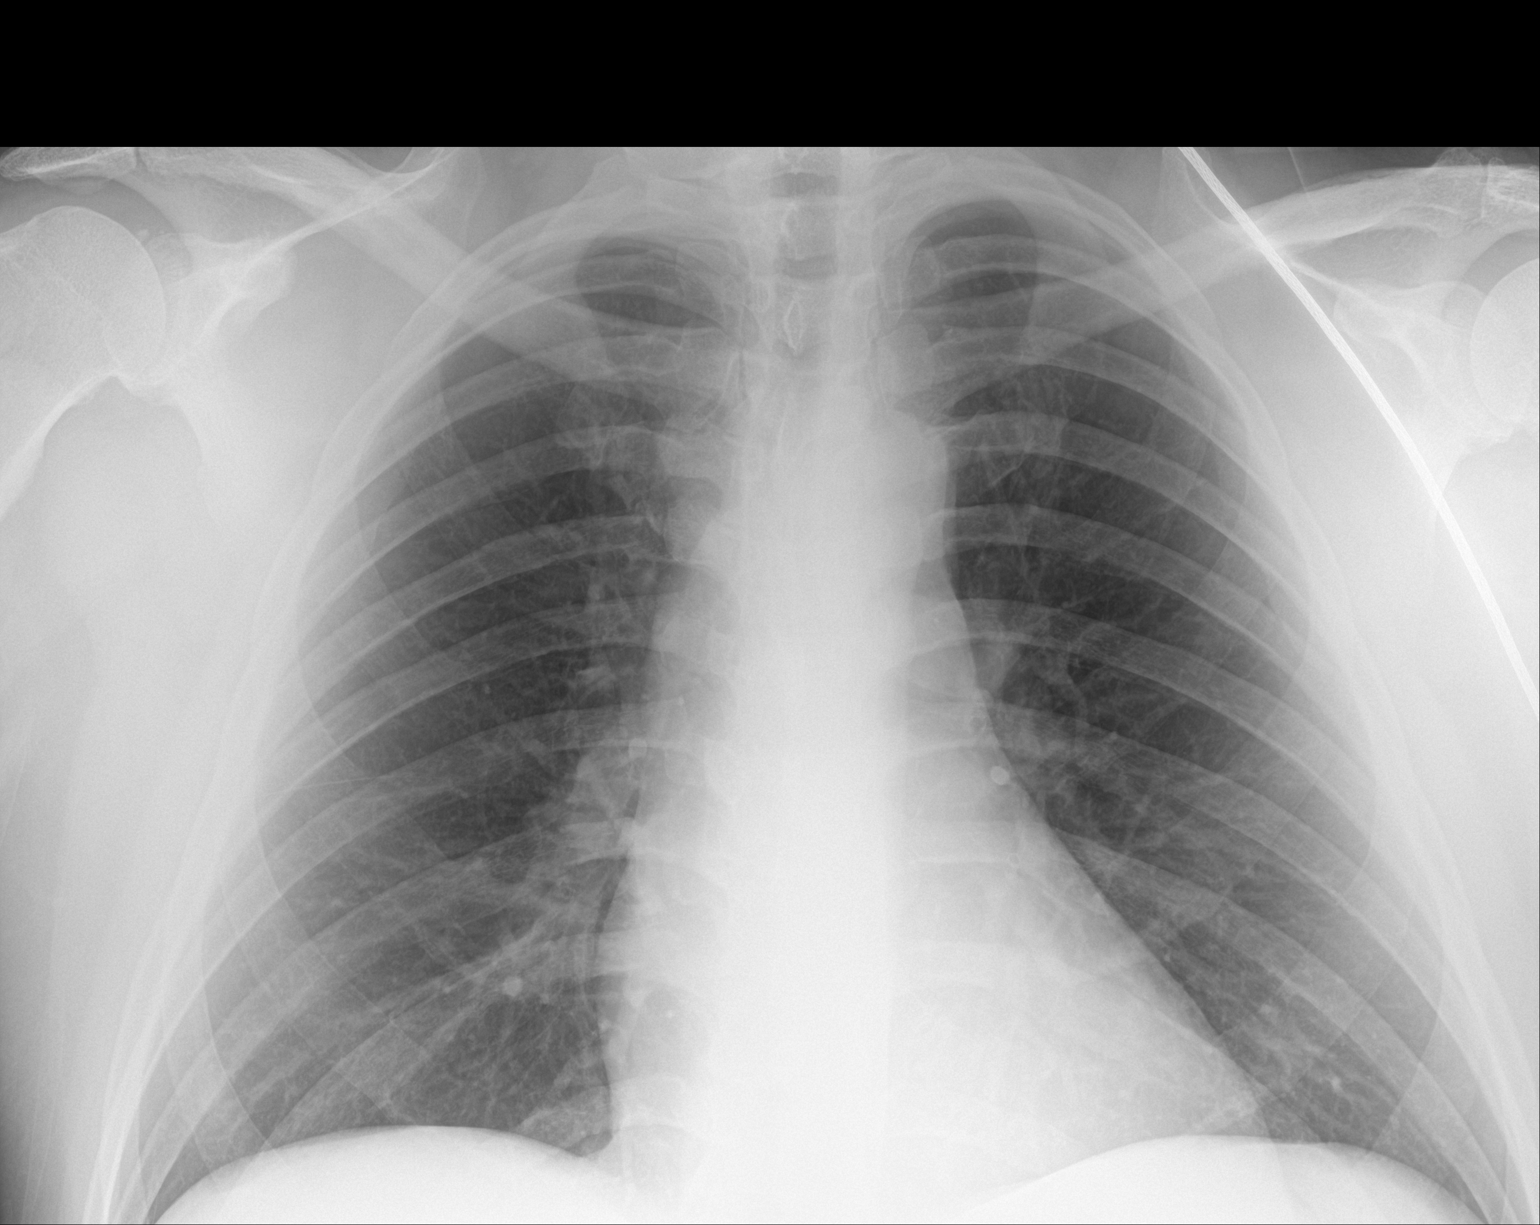
[im 2/2]
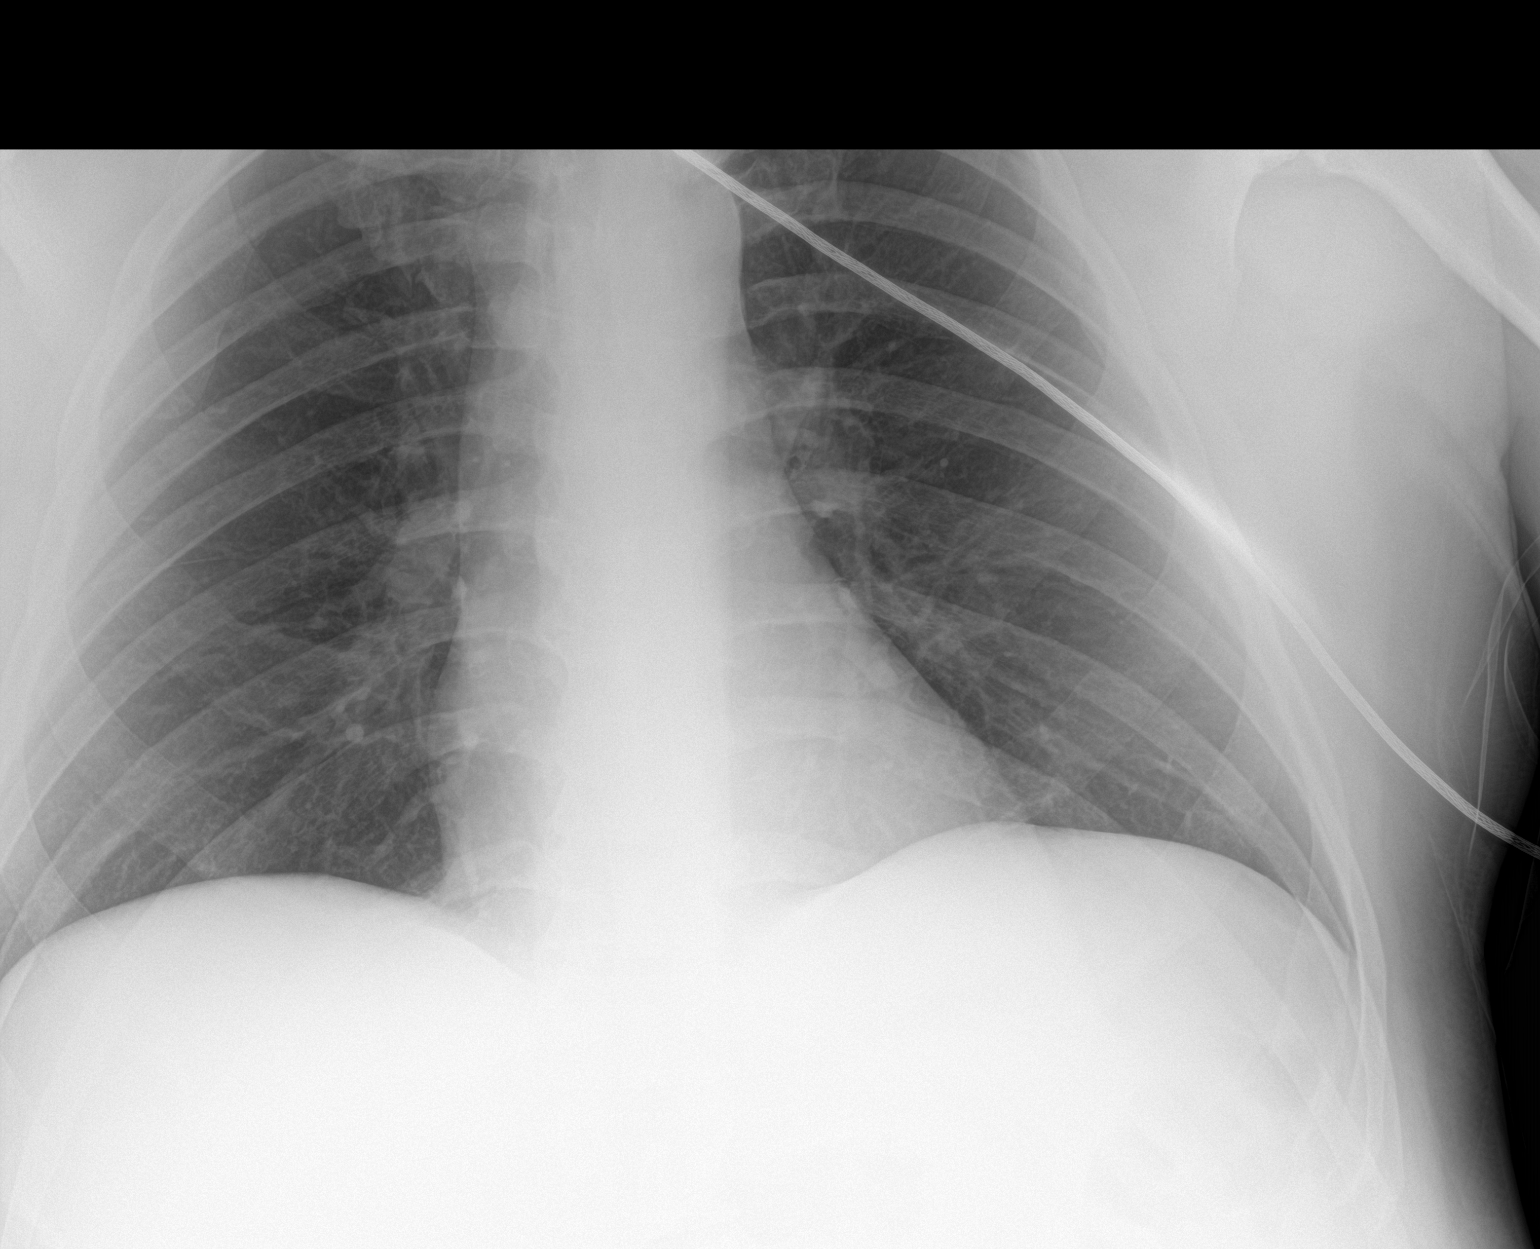

[2 of 2 positions shown; findings below may reference images not displayed]

FINDINGS: The right costophrenic angle has been excluded from the examination.
There is no focal lung infiltrate or pneumothorax. There is no
definite pleural effusion. The cardiomediastinal silhouette is
within normal limits. No acute fractures are seen.
IMPRESSION: No active disease.

## 2022-01-21 ENCOUNTER — Other Ambulatory Visit: Payer: Self-pay

## 2022-01-22 ENCOUNTER — Other Ambulatory Visit (HOSPITAL_COMMUNITY): Payer: Self-pay

## 2022-01-27 ENCOUNTER — Other Ambulatory Visit (HOSPITAL_COMMUNITY): Payer: Self-pay

## 2022-02-10 ENCOUNTER — Other Ambulatory Visit (HOSPITAL_COMMUNITY): Payer: Self-pay

## 2022-02-10 DIAGNOSIS — E7800 Pure hypercholesterolemia, unspecified: Secondary | ICD-10-CM | POA: Diagnosis not present

## 2022-02-11 ENCOUNTER — Other Ambulatory Visit: Payer: Self-pay

## 2022-02-13 ENCOUNTER — Other Ambulatory Visit (HOSPITAL_COMMUNITY): Payer: Self-pay

## 2022-02-14 ENCOUNTER — Other Ambulatory Visit (HOSPITAL_COMMUNITY): Payer: Self-pay

## 2022-02-14 MED ORDER — NUEDEXTA 20-10 MG PO CAPS
ORAL_CAPSULE | Freq: Two times a day (BID) | ORAL | 1 refills | Status: AC
  Filled 2022-02-14: qty 60, 30d supply, fill #0
  Filled 2022-08-18: qty 60, 30d supply, fill #1

## 2022-02-19 ENCOUNTER — Other Ambulatory Visit (HOSPITAL_COMMUNITY): Payer: Self-pay

## 2022-02-19 MED ORDER — LOSARTAN POTASSIUM 100 MG PO TABS
100.0000 mg | ORAL_TABLET | Freq: Every day | ORAL | 1 refills | Status: DC
  Filled 2022-02-19: qty 90, 90d supply, fill #0
  Filled 2022-05-28: qty 90, 90d supply, fill #1

## 2022-02-24 ENCOUNTER — Other Ambulatory Visit (HOSPITAL_COMMUNITY): Payer: Self-pay

## 2022-02-24 MED ORDER — QUETIAPINE FUMARATE 400 MG PO TABS
400.0000 mg | ORAL_TABLET | Freq: Every evening | ORAL | 2 refills | Status: DC
  Filled 2022-02-24 – 2022-04-04 (×2): qty 30, 30d supply, fill #0
  Filled 2022-04-25: qty 30, 30d supply, fill #1
  Filled 2022-05-28: qty 30, 30d supply, fill #2

## 2022-02-25 ENCOUNTER — Other Ambulatory Visit (HOSPITAL_COMMUNITY): Payer: Self-pay

## 2022-03-04 ENCOUNTER — Other Ambulatory Visit (HOSPITAL_COMMUNITY): Payer: Self-pay

## 2022-03-07 ENCOUNTER — Other Ambulatory Visit (HOSPITAL_COMMUNITY): Payer: Self-pay

## 2022-03-09 ENCOUNTER — Other Ambulatory Visit (HOSPITAL_COMMUNITY): Payer: Self-pay

## 2022-03-09 MED ORDER — ATORVASTATIN CALCIUM 40 MG PO TABS
40.0000 mg | ORAL_TABLET | Freq: Every day | ORAL | 3 refills | Status: DC
  Filled 2022-03-09: qty 30, 30d supply, fill #0
  Filled 2022-04-15: qty 30, 30d supply, fill #1
  Filled 2022-05-23: qty 30, 30d supply, fill #2
  Filled 2022-06-20: qty 30, 30d supply, fill #3

## 2022-03-11 ENCOUNTER — Other Ambulatory Visit (HOSPITAL_COMMUNITY): Payer: Self-pay

## 2022-03-13 ENCOUNTER — Other Ambulatory Visit (HOSPITAL_COMMUNITY): Payer: Self-pay

## 2022-03-23 ENCOUNTER — Other Ambulatory Visit (HOSPITAL_COMMUNITY): Payer: Self-pay

## 2022-03-23 DIAGNOSIS — Z79899 Other long term (current) drug therapy: Secondary | ICD-10-CM | POA: Diagnosis not present

## 2022-03-23 DIAGNOSIS — I10 Essential (primary) hypertension: Secondary | ICD-10-CM | POA: Diagnosis not present

## 2022-03-23 DIAGNOSIS — R55 Syncope and collapse: Secondary | ICD-10-CM | POA: Diagnosis not present

## 2022-03-23 DIAGNOSIS — R109 Unspecified abdominal pain: Secondary | ICD-10-CM | POA: Diagnosis not present

## 2022-03-23 MED ORDER — HYOSCYAMINE SULFATE 0.125 MG PO TBDP
ORAL_TABLET | ORAL | 0 refills | Status: AC
  Filled 2022-03-23: qty 20, 6d supply, fill #0

## 2022-03-24 ENCOUNTER — Other Ambulatory Visit (HOSPITAL_COMMUNITY): Payer: Self-pay

## 2022-03-26 ENCOUNTER — Other Ambulatory Visit (HOSPITAL_COMMUNITY): Payer: Self-pay

## 2022-03-27 ENCOUNTER — Other Ambulatory Visit (HOSPITAL_COMMUNITY): Payer: Self-pay

## 2022-04-02 ENCOUNTER — Other Ambulatory Visit (HOSPITAL_COMMUNITY): Payer: Self-pay

## 2022-04-03 ENCOUNTER — Other Ambulatory Visit (HOSPITAL_COMMUNITY): Payer: Self-pay

## 2022-04-04 ENCOUNTER — Other Ambulatory Visit (HOSPITAL_COMMUNITY): Payer: Self-pay

## 2022-04-04 MED ORDER — PANTOPRAZOLE SODIUM 40 MG PO TBEC
40.0000 mg | DELAYED_RELEASE_TABLET | Freq: Two times a day (BID) | ORAL | 0 refills | Status: DC
  Filled 2022-04-04: qty 180, 90d supply, fill #0

## 2022-04-06 ENCOUNTER — Other Ambulatory Visit (HOSPITAL_COMMUNITY): Payer: Self-pay

## 2022-04-06 MED ORDER — NUEDEXTA 20-10 MG PO CAPS
1.0000 | ORAL_CAPSULE | Freq: Two times a day (BID) | ORAL | 1 refills | Status: DC
  Filled 2022-04-06: qty 60, 30d supply, fill #0
  Filled 2022-05-13: qty 60, 30d supply, fill #1

## 2022-04-08 ENCOUNTER — Other Ambulatory Visit (HOSPITAL_COMMUNITY): Payer: Self-pay

## 2022-04-09 ENCOUNTER — Other Ambulatory Visit (HOSPITAL_COMMUNITY): Payer: Self-pay

## 2022-04-14 ENCOUNTER — Other Ambulatory Visit (HOSPITAL_COMMUNITY): Payer: Self-pay

## 2022-04-14 DIAGNOSIS — F3177 Bipolar disorder, in partial remission, most recent episode mixed: Secondary | ICD-10-CM | POA: Diagnosis not present

## 2022-04-14 DIAGNOSIS — F17200 Nicotine dependence, unspecified, uncomplicated: Secondary | ICD-10-CM | POA: Diagnosis not present

## 2022-04-14 DIAGNOSIS — E7800 Pure hypercholesterolemia, unspecified: Secondary | ICD-10-CM | POA: Diagnosis not present

## 2022-04-14 DIAGNOSIS — Z87820 Personal history of traumatic brain injury: Secondary | ICD-10-CM | POA: Diagnosis not present

## 2022-04-14 DIAGNOSIS — I10 Essential (primary) hypertension: Secondary | ICD-10-CM | POA: Diagnosis not present

## 2022-04-14 DIAGNOSIS — K219 Gastro-esophageal reflux disease without esophagitis: Secondary | ICD-10-CM | POA: Diagnosis not present

## 2022-04-14 MED ORDER — CARBAMAZEPINE ER 200 MG PO TB12
200.0000 mg | ORAL_TABLET | Freq: Two times a day (BID) | ORAL | 2 refills | Status: DC
  Filled 2022-04-14: qty 60, 30d supply, fill #0
  Filled 2022-05-13: qty 60, 30d supply, fill #1
  Filled 2022-06-09: qty 60, 30d supply, fill #2

## 2022-04-15 ENCOUNTER — Other Ambulatory Visit (HOSPITAL_COMMUNITY): Payer: Self-pay

## 2022-04-17 ENCOUNTER — Other Ambulatory Visit (HOSPITAL_COMMUNITY): Payer: Self-pay

## 2022-04-26 ENCOUNTER — Other Ambulatory Visit (HOSPITAL_COMMUNITY): Payer: Self-pay

## 2022-05-15 ENCOUNTER — Other Ambulatory Visit (HOSPITAL_COMMUNITY): Payer: Self-pay

## 2022-05-26 ENCOUNTER — Other Ambulatory Visit (HOSPITAL_COMMUNITY): Payer: Self-pay

## 2022-06-09 ENCOUNTER — Other Ambulatory Visit (HOSPITAL_COMMUNITY): Payer: Self-pay

## 2022-06-09 ENCOUNTER — Other Ambulatory Visit: Payer: Self-pay

## 2022-06-10 ENCOUNTER — Other Ambulatory Visit (HOSPITAL_COMMUNITY): Payer: Self-pay

## 2022-06-10 MED ORDER — DEXTROMETHORPHAN-QUINIDINE 20-10 MG PO CAPS
1.0000 | ORAL_CAPSULE | Freq: Two times a day (BID) | ORAL | 1 refills | Status: AC
  Filled 2022-06-10: qty 60, 30d supply, fill #0
  Filled 2022-07-09: qty 60, 30d supply, fill #1

## 2022-06-12 ENCOUNTER — Other Ambulatory Visit (HOSPITAL_COMMUNITY): Payer: Self-pay

## 2022-06-20 ENCOUNTER — Other Ambulatory Visit (HOSPITAL_COMMUNITY): Payer: Self-pay

## 2022-06-22 ENCOUNTER — Other Ambulatory Visit (HOSPITAL_COMMUNITY): Payer: Self-pay

## 2022-06-22 MED ORDER — QUETIAPINE FUMARATE 400 MG PO TABS
400.0000 mg | ORAL_TABLET | Freq: Every evening | ORAL | 0 refills | Status: DC
  Filled 2022-06-22: qty 30, 30d supply, fill #0

## 2022-06-26 ENCOUNTER — Other Ambulatory Visit: Payer: Self-pay

## 2022-07-09 ENCOUNTER — Other Ambulatory Visit (HOSPITAL_COMMUNITY): Payer: Self-pay

## 2022-07-09 MED ORDER — CARBAMAZEPINE ER 200 MG PO TB12
200.0000 mg | ORAL_TABLET | Freq: Two times a day (BID) | ORAL | 0 refills | Status: DC
  Filled 2022-07-09: qty 60, 30d supply, fill #0

## 2022-07-15 ENCOUNTER — Other Ambulatory Visit (HOSPITAL_COMMUNITY): Payer: Self-pay

## 2022-07-16 ENCOUNTER — Other Ambulatory Visit: Payer: Self-pay

## 2022-07-16 ENCOUNTER — Other Ambulatory Visit (HOSPITAL_COMMUNITY): Payer: Self-pay

## 2022-07-16 MED ORDER — PANTOPRAZOLE SODIUM 40 MG PO TBEC
40.0000 mg | DELAYED_RELEASE_TABLET | Freq: Two times a day (BID) | ORAL | 1 refills | Status: DC
  Filled 2022-07-16: qty 180, 90d supply, fill #0
  Filled 2022-09-07: qty 180, 90d supply, fill #1

## 2022-07-26 ENCOUNTER — Other Ambulatory Visit (HOSPITAL_COMMUNITY): Payer: Self-pay

## 2022-07-27 ENCOUNTER — Other Ambulatory Visit (HOSPITAL_COMMUNITY): Payer: Self-pay

## 2022-07-28 ENCOUNTER — Other Ambulatory Visit (HOSPITAL_COMMUNITY): Payer: Self-pay

## 2022-07-28 MED ORDER — QUETIAPINE FUMARATE 400 MG PO TABS
400.0000 mg | ORAL_TABLET | Freq: Every day | ORAL | 0 refills | Status: DC
  Filled 2022-07-28: qty 30, 30d supply, fill #0

## 2022-07-28 MED ORDER — NUEDEXTA 20-10 MG PO CAPS
ORAL_CAPSULE | ORAL | 2 refills | Status: AC
  Filled 2022-07-28 – 2022-09-18 (×3): qty 60, 30d supply, fill #0
  Filled 2022-10-07: qty 60, 30d supply, fill #1
  Filled 2022-11-08: qty 60, 30d supply, fill #2

## 2022-07-28 MED ORDER — CARBAMAZEPINE ER 200 MG PO TB12
200.0000 mg | ORAL_TABLET | Freq: Two times a day (BID) | ORAL | 2 refills | Status: AC
  Filled 2022-07-28 – 2022-08-11 (×3): qty 60, 30d supply, fill #0
  Filled 2022-09-05: qty 60, 30d supply, fill #1

## 2022-07-28 MED ORDER — ATORVASTATIN CALCIUM 40 MG PO TABS
40.0000 mg | ORAL_TABLET | Freq: Every day | ORAL | 2 refills | Status: DC
  Filled 2022-07-28 – 2022-09-05 (×2): qty 30, 30d supply, fill #0
  Filled 2022-09-12 – 2022-10-07 (×2): qty 30, 30d supply, fill #1
  Filled 2022-11-08: qty 30, 30d supply, fill #2

## 2022-07-28 MED ORDER — QUETIAPINE FUMARATE 400 MG PO TABS
400.0000 mg | ORAL_TABLET | Freq: Every evening | ORAL | 2 refills | Status: DC
  Filled 2022-07-28 – 2022-10-30 (×3): qty 30, 30d supply, fill #0
  Filled 2022-11-22: qty 30, 30d supply, fill #1
  Filled 2023-03-23: qty 30, 30d supply, fill #2

## 2022-07-29 ENCOUNTER — Other Ambulatory Visit (HOSPITAL_COMMUNITY): Payer: Self-pay

## 2022-07-30 ENCOUNTER — Other Ambulatory Visit (HOSPITAL_COMMUNITY): Payer: Self-pay

## 2022-07-31 ENCOUNTER — Other Ambulatory Visit (HOSPITAL_COMMUNITY): Payer: Self-pay

## 2022-08-01 ENCOUNTER — Other Ambulatory Visit (HOSPITAL_COMMUNITY): Payer: Self-pay

## 2022-08-03 ENCOUNTER — Other Ambulatory Visit (HOSPITAL_COMMUNITY): Payer: Self-pay

## 2022-08-04 ENCOUNTER — Other Ambulatory Visit (HOSPITAL_COMMUNITY): Payer: Self-pay

## 2022-08-05 ENCOUNTER — Other Ambulatory Visit (HOSPITAL_COMMUNITY): Payer: Self-pay

## 2022-08-06 ENCOUNTER — Other Ambulatory Visit (HOSPITAL_COMMUNITY): Payer: Self-pay

## 2022-08-07 ENCOUNTER — Other Ambulatory Visit (HOSPITAL_COMMUNITY): Payer: Self-pay

## 2022-08-10 ENCOUNTER — Other Ambulatory Visit (HOSPITAL_COMMUNITY): Payer: Self-pay

## 2022-08-11 ENCOUNTER — Other Ambulatory Visit (HOSPITAL_COMMUNITY): Payer: Self-pay

## 2022-08-15 ENCOUNTER — Other Ambulatory Visit (HOSPITAL_COMMUNITY): Payer: Self-pay

## 2022-08-17 ENCOUNTER — Other Ambulatory Visit (HOSPITAL_COMMUNITY): Payer: Self-pay

## 2022-08-18 ENCOUNTER — Other Ambulatory Visit (HOSPITAL_COMMUNITY): Payer: Self-pay

## 2022-08-30 ENCOUNTER — Other Ambulatory Visit (HOSPITAL_COMMUNITY): Payer: Self-pay

## 2022-08-31 ENCOUNTER — Other Ambulatory Visit (HOSPITAL_COMMUNITY): Payer: Self-pay

## 2022-08-31 MED ORDER — QUETIAPINE FUMARATE 400 MG PO TABS
400.0000 mg | ORAL_TABLET | Freq: Every day | ORAL | 0 refills | Status: DC
  Filled 2022-08-31: qty 30, 30d supply, fill #0

## 2022-09-05 ENCOUNTER — Other Ambulatory Visit (HOSPITAL_BASED_OUTPATIENT_CLINIC_OR_DEPARTMENT_OTHER): Payer: Self-pay

## 2022-09-05 ENCOUNTER — Other Ambulatory Visit (HOSPITAL_COMMUNITY): Payer: Self-pay

## 2022-09-07 ENCOUNTER — Other Ambulatory Visit (HOSPITAL_COMMUNITY): Payer: Self-pay

## 2022-09-07 ENCOUNTER — Other Ambulatory Visit: Payer: Self-pay

## 2022-09-08 ENCOUNTER — Other Ambulatory Visit (HOSPITAL_COMMUNITY): Payer: Self-pay

## 2022-09-12 ENCOUNTER — Other Ambulatory Visit (HOSPITAL_COMMUNITY): Payer: Self-pay

## 2022-09-13 ENCOUNTER — Other Ambulatory Visit (HOSPITAL_COMMUNITY): Payer: Self-pay

## 2022-09-14 ENCOUNTER — Other Ambulatory Visit (HOSPITAL_COMMUNITY): Payer: Self-pay

## 2022-09-14 MED ORDER — LOSARTAN POTASSIUM 100 MG PO TABS
100.0000 mg | ORAL_TABLET | Freq: Every day | ORAL | 0 refills | Status: DC
  Filled 2022-09-14: qty 90, 90d supply, fill #0

## 2022-09-15 ENCOUNTER — Other Ambulatory Visit (HOSPITAL_COMMUNITY): Payer: Self-pay

## 2022-09-18 ENCOUNTER — Other Ambulatory Visit (HOSPITAL_COMMUNITY): Payer: Self-pay

## 2022-09-26 ENCOUNTER — Other Ambulatory Visit (HOSPITAL_COMMUNITY): Payer: Self-pay

## 2022-09-28 ENCOUNTER — Other Ambulatory Visit (HOSPITAL_COMMUNITY): Payer: Self-pay

## 2022-09-28 MED ORDER — QUETIAPINE FUMARATE 400 MG PO TABS
400.0000 mg | ORAL_TABLET | Freq: Every day | ORAL | 0 refills | Status: AC
  Filled 2022-09-28: qty 30, 30d supply, fill #0

## 2022-09-29 ENCOUNTER — Other Ambulatory Visit (HOSPITAL_COMMUNITY): Payer: Self-pay

## 2022-10-02 ENCOUNTER — Other Ambulatory Visit (HOSPITAL_COMMUNITY): Payer: Self-pay

## 2022-10-06 ENCOUNTER — Other Ambulatory Visit (HOSPITAL_COMMUNITY): Payer: Self-pay

## 2022-10-06 MED ORDER — QUETIAPINE FUMARATE 400 MG PO TABS
400.0000 mg | ORAL_TABLET | Freq: Every evening | ORAL | 2 refills | Status: AC
  Filled 2022-12-22: qty 30, 30d supply, fill #0
  Filled 2023-01-20: qty 30, 30d supply, fill #1
  Filled 2023-02-20: qty 30, 30d supply, fill #2

## 2022-10-06 MED ORDER — NUEDEXTA 20-10 MG PO CAPS
1.0000 | ORAL_CAPSULE | Freq: Two times a day (BID) | ORAL | 2 refills | Status: DC
  Filled 2022-12-18: qty 60, 30d supply, fill #0
  Filled 2023-01-16: qty 60, 30d supply, fill #1
  Filled 2023-02-13: qty 60, 30d supply, fill #2

## 2022-10-06 MED ORDER — CARBAMAZEPINE ER 200 MG PO TB12
200.0000 mg | ORAL_TABLET | Freq: Two times a day (BID) | ORAL | 2 refills | Status: AC
  Filled 2022-10-06: qty 60, 30d supply, fill #0
  Filled 2022-11-15 – 2022-12-10 (×2): qty 60, 30d supply, fill #1
  Filled 2023-03-08: qty 60, 30d supply, fill #2

## 2022-10-08 ENCOUNTER — Other Ambulatory Visit: Payer: Self-pay

## 2022-10-08 ENCOUNTER — Other Ambulatory Visit (HOSPITAL_BASED_OUTPATIENT_CLINIC_OR_DEPARTMENT_OTHER): Payer: Self-pay

## 2022-10-12 ENCOUNTER — Other Ambulatory Visit (HOSPITAL_COMMUNITY): Payer: Self-pay

## 2022-10-13 ENCOUNTER — Other Ambulatory Visit (HOSPITAL_COMMUNITY): Payer: Self-pay

## 2022-10-13 DIAGNOSIS — F3177 Bipolar disorder, in partial remission, most recent episode mixed: Secondary | ICD-10-CM | POA: Diagnosis not present

## 2022-10-13 DIAGNOSIS — I10 Essential (primary) hypertension: Secondary | ICD-10-CM | POA: Diagnosis not present

## 2022-10-13 DIAGNOSIS — K219 Gastro-esophageal reflux disease without esophagitis: Secondary | ICD-10-CM | POA: Diagnosis not present

## 2022-10-13 DIAGNOSIS — R110 Nausea: Secondary | ICD-10-CM | POA: Diagnosis not present

## 2022-10-13 DIAGNOSIS — Z87820 Personal history of traumatic brain injury: Secondary | ICD-10-CM | POA: Diagnosis not present

## 2022-10-13 DIAGNOSIS — E7800 Pure hypercholesterolemia, unspecified: Secondary | ICD-10-CM | POA: Diagnosis not present

## 2022-10-13 DIAGNOSIS — Z125 Encounter for screening for malignant neoplasm of prostate: Secondary | ICD-10-CM | POA: Diagnosis not present

## 2022-10-13 DIAGNOSIS — F17200 Nicotine dependence, unspecified, uncomplicated: Secondary | ICD-10-CM | POA: Diagnosis not present

## 2022-10-13 MED ORDER — ONDANSETRON 8 MG PO TBDP
ORAL_TABLET | ORAL | 1 refills | Status: AC
  Filled 2022-10-13: qty 30, 10d supply, fill #0

## 2022-10-14 ENCOUNTER — Other Ambulatory Visit (HOSPITAL_COMMUNITY): Payer: Self-pay

## 2022-10-19 ENCOUNTER — Other Ambulatory Visit (HOSPITAL_COMMUNITY): Payer: Self-pay

## 2022-10-26 ENCOUNTER — Other Ambulatory Visit (HOSPITAL_COMMUNITY): Payer: Self-pay

## 2022-10-30 ENCOUNTER — Other Ambulatory Visit (HOSPITAL_COMMUNITY): Payer: Self-pay

## 2022-11-08 ENCOUNTER — Other Ambulatory Visit (HOSPITAL_COMMUNITY): Payer: Self-pay

## 2022-11-10 ENCOUNTER — Other Ambulatory Visit (HOSPITAL_COMMUNITY): Payer: Self-pay

## 2022-11-15 ENCOUNTER — Other Ambulatory Visit (HOSPITAL_COMMUNITY): Payer: Self-pay

## 2022-11-16 ENCOUNTER — Other Ambulatory Visit: Payer: Self-pay

## 2022-11-16 ENCOUNTER — Other Ambulatory Visit (HOSPITAL_COMMUNITY): Payer: Self-pay

## 2022-11-16 MED ORDER — ATORVASTATIN CALCIUM 40 MG PO TABS
40.0000 mg | ORAL_TABLET | Freq: Every day | ORAL | 10 refills | Status: AC
  Filled 2022-11-16 – 2022-12-10 (×2): qty 30, 30d supply, fill #0
  Filled 2023-01-20: qty 30, 30d supply, fill #1
  Filled 2023-04-22: qty 30, 30d supply, fill #2
  Filled 2023-06-16: qty 30, 30d supply, fill #3
  Filled 2023-07-13: qty 30, 30d supply, fill #4
  Filled 2023-08-23: qty 30, 30d supply, fill #5

## 2022-11-18 ENCOUNTER — Other Ambulatory Visit (HOSPITAL_COMMUNITY): Payer: Self-pay

## 2022-11-19 ENCOUNTER — Other Ambulatory Visit (HOSPITAL_COMMUNITY): Payer: Self-pay

## 2022-11-26 ENCOUNTER — Other Ambulatory Visit (HOSPITAL_COMMUNITY): Payer: Self-pay

## 2022-12-01 ENCOUNTER — Other Ambulatory Visit (HOSPITAL_COMMUNITY): Payer: Self-pay

## 2022-12-08 ENCOUNTER — Other Ambulatory Visit (HOSPITAL_COMMUNITY): Payer: Self-pay

## 2022-12-10 ENCOUNTER — Other Ambulatory Visit (HOSPITAL_COMMUNITY): Payer: Self-pay

## 2022-12-11 ENCOUNTER — Other Ambulatory Visit (HOSPITAL_COMMUNITY): Payer: Self-pay

## 2022-12-11 ENCOUNTER — Other Ambulatory Visit: Payer: Self-pay

## 2022-12-11 MED ORDER — LOSARTAN POTASSIUM 100 MG PO TABS
100.0000 mg | ORAL_TABLET | Freq: Every day | ORAL | 0 refills | Status: DC
  Filled 2022-12-11: qty 90, 90d supply, fill #0

## 2022-12-15 ENCOUNTER — Other Ambulatory Visit (HOSPITAL_COMMUNITY): Payer: Self-pay

## 2022-12-16 ENCOUNTER — Other Ambulatory Visit (HOSPITAL_COMMUNITY): Payer: Self-pay

## 2022-12-18 ENCOUNTER — Other Ambulatory Visit (HOSPITAL_COMMUNITY): Payer: Self-pay

## 2022-12-19 ENCOUNTER — Other Ambulatory Visit (HOSPITAL_COMMUNITY): Payer: Self-pay

## 2022-12-21 ENCOUNTER — Other Ambulatory Visit (HOSPITAL_COMMUNITY): Payer: Self-pay

## 2022-12-22 ENCOUNTER — Other Ambulatory Visit (HOSPITAL_COMMUNITY): Payer: Self-pay

## 2023-01-05 ENCOUNTER — Other Ambulatory Visit (HOSPITAL_COMMUNITY): Payer: Self-pay

## 2023-01-05 ENCOUNTER — Other Ambulatory Visit: Payer: Self-pay

## 2023-01-05 MED ORDER — NUEDEXTA 20-10 MG PO CAPS
ORAL_CAPSULE | Freq: Two times a day (BID) | ORAL | 2 refills | Status: DC
  Filled 2023-03-23: qty 60, 30d supply, fill #0
  Filled 2023-07-13: qty 60, 30d supply, fill #1
  Filled 2023-08-17: qty 60, 30d supply, fill #2

## 2023-01-05 MED ORDER — QUETIAPINE FUMARATE 400 MG PO TABS
ORAL_TABLET | ORAL | 2 refills | Status: AC
  Filled 2023-05-13: qty 30, 30d supply, fill #0
  Filled 2023-06-13: qty 30, 30d supply, fill #1
  Filled 2023-07-10 – 2023-07-13 (×2): qty 30, 30d supply, fill #2

## 2023-01-05 MED ORDER — CARBAMAZEPINE ER 200 MG PO TB12
200.0000 mg | ORAL_TABLET | Freq: Two times a day (BID) | ORAL | 2 refills | Status: AC
  Filled 2023-01-05: qty 60, 30d supply, fill #0
  Filled 2023-02-13: qty 60, 30d supply, fill #1
  Filled 2023-04-04: qty 60, 30d supply, fill #2

## 2023-01-06 ENCOUNTER — Other Ambulatory Visit (HOSPITAL_COMMUNITY): Payer: Self-pay

## 2023-01-14 ENCOUNTER — Other Ambulatory Visit (HOSPITAL_COMMUNITY): Payer: Self-pay

## 2023-01-16 ENCOUNTER — Other Ambulatory Visit (HOSPITAL_COMMUNITY): Payer: Self-pay

## 2023-01-18 ENCOUNTER — Other Ambulatory Visit (HOSPITAL_COMMUNITY): Payer: Self-pay

## 2023-01-19 ENCOUNTER — Other Ambulatory Visit (HOSPITAL_COMMUNITY): Payer: Self-pay

## 2023-01-19 ENCOUNTER — Other Ambulatory Visit (HOSPITAL_BASED_OUTPATIENT_CLINIC_OR_DEPARTMENT_OTHER): Payer: Self-pay

## 2023-01-19 MED ORDER — PANTOPRAZOLE SODIUM 40 MG PO TBEC
40.0000 mg | DELAYED_RELEASE_TABLET | Freq: Two times a day (BID) | ORAL | 1 refills | Status: DC
  Filled 2023-01-19: qty 180, 90d supply, fill #0
  Filled 2023-04-22: qty 180, 90d supply, fill #1

## 2023-01-26 ENCOUNTER — Other Ambulatory Visit (HOSPITAL_COMMUNITY): Payer: Self-pay

## 2023-02-22 ENCOUNTER — Other Ambulatory Visit: Payer: Self-pay

## 2023-03-09 ENCOUNTER — Other Ambulatory Visit (HOSPITAL_COMMUNITY): Payer: Self-pay

## 2023-03-14 ENCOUNTER — Other Ambulatory Visit (HOSPITAL_COMMUNITY): Payer: Self-pay

## 2023-03-15 ENCOUNTER — Other Ambulatory Visit (HOSPITAL_COMMUNITY): Payer: Self-pay

## 2023-03-15 MED ORDER — LOSARTAN POTASSIUM 100 MG PO TABS
100.0000 mg | ORAL_TABLET | Freq: Every day | ORAL | 0 refills | Status: DC
  Filled 2023-03-15: qty 90, 90d supply, fill #0

## 2023-03-19 ENCOUNTER — Other Ambulatory Visit (HOSPITAL_COMMUNITY): Payer: Self-pay

## 2023-03-20 ENCOUNTER — Other Ambulatory Visit (HOSPITAL_COMMUNITY): Payer: Self-pay

## 2023-03-22 ENCOUNTER — Other Ambulatory Visit (HOSPITAL_COMMUNITY): Payer: Self-pay

## 2023-03-23 ENCOUNTER — Other Ambulatory Visit (HOSPITAL_COMMUNITY): Payer: Self-pay

## 2023-03-31 ENCOUNTER — Other Ambulatory Visit (HOSPITAL_COMMUNITY): Payer: Self-pay

## 2023-04-04 ENCOUNTER — Other Ambulatory Visit (HOSPITAL_COMMUNITY): Payer: Self-pay

## 2023-04-05 ENCOUNTER — Other Ambulatory Visit (HOSPITAL_COMMUNITY): Payer: Self-pay

## 2023-04-05 MED ORDER — NUEDEXTA 20-10 MG PO CAPS
1.0000 | ORAL_CAPSULE | Freq: Two times a day (BID) | ORAL | 1 refills | Status: AC
  Filled 2023-04-05 – 2023-04-22 (×2): qty 60, 30d supply, fill #0

## 2023-04-10 ENCOUNTER — Other Ambulatory Visit (HOSPITAL_COMMUNITY): Payer: Self-pay

## 2023-04-12 ENCOUNTER — Other Ambulatory Visit (HOSPITAL_COMMUNITY): Payer: Self-pay

## 2023-04-12 MED ORDER — QUETIAPINE FUMARATE 400 MG PO TABS
400.0000 mg | ORAL_TABLET | Freq: Every evening | ORAL | 0 refills | Status: AC
  Filled 2023-04-12 – 2023-04-14 (×2): qty 30, 30d supply, fill #0

## 2023-04-14 ENCOUNTER — Other Ambulatory Visit (HOSPITAL_COMMUNITY): Payer: Self-pay

## 2023-04-15 ENCOUNTER — Other Ambulatory Visit (HOSPITAL_COMMUNITY): Payer: Self-pay

## 2023-04-20 ENCOUNTER — Other Ambulatory Visit (HOSPITAL_COMMUNITY): Payer: Self-pay

## 2023-04-20 ENCOUNTER — Other Ambulatory Visit: Payer: Self-pay

## 2023-04-20 MED ORDER — QUETIAPINE FUMARATE 400 MG PO TABS
400.0000 mg | ORAL_TABLET | Freq: Every day | ORAL | 2 refills | Status: AC
  Filled 2023-04-20 – 2023-08-16 (×2): qty 30, 30d supply, fill #0
  Filled 2023-09-06 – 2023-09-07 (×3): qty 30, 30d supply, fill #1
  Filled 2023-10-04: qty 30, 30d supply, fill #2

## 2023-04-20 MED ORDER — NUEDEXTA 20-10 MG PO CAPS
1.0000 | ORAL_CAPSULE | Freq: Two times a day (BID) | ORAL | 2 refills | Status: AC
  Filled 2023-04-20: qty 60, 30d supply, fill #0
  Filled 2023-05-21: qty 60, 30d supply, fill #1
  Filled 2023-06-13: qty 60, 30d supply, fill #2

## 2023-04-20 MED ORDER — CARBAMAZEPINE 200 MG PO TABS
200.0000 mg | ORAL_TABLET | Freq: Two times a day (BID) | ORAL | 2 refills | Status: DC
  Filled 2023-04-20: qty 60, 30d supply, fill #0
  Filled 2023-06-21: qty 60, 30d supply, fill #1
  Filled 2023-07-13: qty 60, 30d supply, fill #2

## 2023-04-23 ENCOUNTER — Other Ambulatory Visit (HOSPITAL_COMMUNITY): Payer: Self-pay

## 2023-04-24 ENCOUNTER — Other Ambulatory Visit (HOSPITAL_COMMUNITY): Payer: Self-pay

## 2023-05-12 ENCOUNTER — Other Ambulatory Visit (HOSPITAL_COMMUNITY): Payer: Self-pay

## 2023-05-13 ENCOUNTER — Other Ambulatory Visit (HOSPITAL_COMMUNITY): Payer: Self-pay

## 2023-05-14 ENCOUNTER — Other Ambulatory Visit (HOSPITAL_COMMUNITY): Payer: Self-pay

## 2023-05-21 ENCOUNTER — Other Ambulatory Visit (HOSPITAL_COMMUNITY): Payer: Self-pay

## 2023-05-24 ENCOUNTER — Other Ambulatory Visit (HOSPITAL_COMMUNITY): Payer: Self-pay

## 2023-05-24 MED ORDER — PANTOPRAZOLE SODIUM 40 MG PO TBEC
40.0000 mg | DELAYED_RELEASE_TABLET | Freq: Two times a day (BID) | ORAL | 0 refills | Status: DC
  Filled 2023-05-24 – 2023-07-13 (×2): qty 60, 30d supply, fill #0

## 2023-05-28 ENCOUNTER — Other Ambulatory Visit (HOSPITAL_COMMUNITY): Payer: Self-pay

## 2023-05-31 ENCOUNTER — Other Ambulatory Visit (HOSPITAL_COMMUNITY): Payer: Self-pay

## 2023-06-15 ENCOUNTER — Other Ambulatory Visit (HOSPITAL_COMMUNITY): Payer: Self-pay

## 2023-06-28 ENCOUNTER — Other Ambulatory Visit (HOSPITAL_COMMUNITY): Payer: Self-pay

## 2023-06-29 ENCOUNTER — Other Ambulatory Visit: Payer: Self-pay

## 2023-06-29 ENCOUNTER — Other Ambulatory Visit (HOSPITAL_COMMUNITY): Payer: Self-pay

## 2023-06-29 MED ORDER — QUETIAPINE FUMARATE 400 MG PO TABS
400.0000 mg | ORAL_TABLET | Freq: Every day | ORAL | 2 refills | Status: AC
  Filled 2023-06-29 – 2023-11-08 (×3): qty 30, 30d supply, fill #0
  Filled 2023-11-27 – 2023-11-30 (×2): qty 30, 30d supply, fill #1
  Filled 2023-12-21: qty 30, 30d supply, fill #2

## 2023-06-29 MED ORDER — NUEDEXTA 20-10 MG PO CAPS
1.0000 | ORAL_CAPSULE | Freq: Two times a day (BID) | ORAL | 2 refills | Status: AC
  Filled 2023-06-29 – 2023-07-07 (×2): qty 60, 30d supply, fill #0

## 2023-06-29 MED ORDER — CARBAMAZEPINE ER 200 MG PO TB12
200.0000 mg | ORAL_TABLET | Freq: Two times a day (BID) | ORAL | 2 refills | Status: DC
  Filled 2023-06-29 – 2023-08-30 (×2): qty 60, 30d supply, fill #0

## 2023-07-07 ENCOUNTER — Other Ambulatory Visit (HOSPITAL_COMMUNITY): Payer: Self-pay

## 2023-07-10 ENCOUNTER — Other Ambulatory Visit (HOSPITAL_COMMUNITY): Payer: Self-pay

## 2023-07-13 ENCOUNTER — Other Ambulatory Visit (HOSPITAL_COMMUNITY): Payer: Self-pay

## 2023-07-13 ENCOUNTER — Other Ambulatory Visit: Payer: Self-pay

## 2023-07-14 ENCOUNTER — Other Ambulatory Visit (HOSPITAL_COMMUNITY): Payer: Self-pay

## 2023-07-14 MED ORDER — LOSARTAN POTASSIUM 100 MG PO TABS
100.0000 mg | ORAL_TABLET | Freq: Every day | ORAL | 0 refills | Status: DC
  Filled 2023-07-14: qty 30, 30d supply, fill #0

## 2023-07-16 ENCOUNTER — Other Ambulatory Visit (HOSPITAL_COMMUNITY): Payer: Self-pay

## 2023-08-04 ENCOUNTER — Other Ambulatory Visit (HOSPITAL_COMMUNITY): Payer: Self-pay

## 2023-08-06 ENCOUNTER — Other Ambulatory Visit (HOSPITAL_COMMUNITY): Payer: Self-pay

## 2023-08-11 ENCOUNTER — Other Ambulatory Visit (HOSPITAL_COMMUNITY): Payer: Self-pay

## 2023-08-15 ENCOUNTER — Other Ambulatory Visit (HOSPITAL_COMMUNITY): Payer: Self-pay

## 2023-08-16 ENCOUNTER — Other Ambulatory Visit (HOSPITAL_COMMUNITY): Payer: Self-pay

## 2023-08-16 ENCOUNTER — Other Ambulatory Visit: Payer: Self-pay

## 2023-08-17 ENCOUNTER — Other Ambulatory Visit (HOSPITAL_COMMUNITY): Payer: Self-pay

## 2023-08-17 ENCOUNTER — Other Ambulatory Visit: Payer: Self-pay

## 2023-08-18 ENCOUNTER — Other Ambulatory Visit (HOSPITAL_COMMUNITY): Payer: Self-pay

## 2023-08-18 MED ORDER — PANTOPRAZOLE SODIUM 40 MG PO TBEC
40.0000 mg | DELAYED_RELEASE_TABLET | Freq: Two times a day (BID) | ORAL | 0 refills | Status: DC
  Filled 2023-08-18: qty 30, 15d supply, fill #0

## 2023-08-18 MED ORDER — LOSARTAN POTASSIUM 100 MG PO TABS
100.0000 mg | ORAL_TABLET | Freq: Every day | ORAL | 0 refills | Status: DC
  Filled 2023-08-18: qty 15, 15d supply, fill #0

## 2023-08-23 ENCOUNTER — Other Ambulatory Visit (HOSPITAL_COMMUNITY): Payer: Self-pay

## 2023-08-24 ENCOUNTER — Other Ambulatory Visit: Payer: Self-pay

## 2023-08-24 ENCOUNTER — Other Ambulatory Visit (HOSPITAL_COMMUNITY): Payer: Self-pay

## 2023-08-24 MED ORDER — LOSARTAN POTASSIUM 100 MG PO TABS
100.0000 mg | ORAL_TABLET | Freq: Every day | ORAL | 0 refills | Status: DC
  Filled 2023-08-24 – 2023-09-06 (×2): qty 7, 7d supply, fill #0

## 2023-08-30 ENCOUNTER — Other Ambulatory Visit (HOSPITAL_COMMUNITY): Payer: Self-pay

## 2023-08-30 ENCOUNTER — Other Ambulatory Visit: Payer: Self-pay

## 2023-08-30 ENCOUNTER — Other Ambulatory Visit (HOSPITAL_BASED_OUTPATIENT_CLINIC_OR_DEPARTMENT_OTHER): Payer: Self-pay

## 2023-08-30 MED ORDER — CARBAMAZEPINE 200 MG PO TABS
200.0000 mg | ORAL_TABLET | Freq: Two times a day (BID) | ORAL | 0 refills | Status: DC
  Filled 2023-08-30: qty 60, 30d supply, fill #0

## 2023-08-31 ENCOUNTER — Other Ambulatory Visit (HOSPITAL_COMMUNITY): Payer: Self-pay

## 2023-09-01 ENCOUNTER — Other Ambulatory Visit (HOSPITAL_COMMUNITY): Payer: Self-pay

## 2023-09-02 ENCOUNTER — Other Ambulatory Visit (HOSPITAL_COMMUNITY): Payer: Self-pay

## 2023-09-02 MED ORDER — PANTOPRAZOLE SODIUM 40 MG PO TBEC
40.0000 mg | DELAYED_RELEASE_TABLET | Freq: Two times a day (BID) | ORAL | 0 refills | Status: AC
  Filled 2023-09-02: qty 7, 4d supply, fill #0

## 2023-09-02 MED ORDER — PANTOPRAZOLE SODIUM 40 MG PO TBEC
40.0000 mg | DELAYED_RELEASE_TABLET | Freq: Two times a day (BID) | ORAL | 0 refills | Status: DC
  Filled 2023-09-02: qty 14, 7d supply, fill #0

## 2023-09-06 ENCOUNTER — Other Ambulatory Visit (HOSPITAL_COMMUNITY): Payer: Self-pay

## 2023-09-06 ENCOUNTER — Other Ambulatory Visit (HOSPITAL_BASED_OUTPATIENT_CLINIC_OR_DEPARTMENT_OTHER): Payer: Self-pay

## 2023-09-07 ENCOUNTER — Other Ambulatory Visit (HOSPITAL_COMMUNITY): Payer: Self-pay

## 2023-09-09 ENCOUNTER — Other Ambulatory Visit (HOSPITAL_COMMUNITY): Payer: Self-pay

## 2023-09-10 ENCOUNTER — Other Ambulatory Visit (HOSPITAL_COMMUNITY): Payer: Self-pay

## 2023-09-11 ENCOUNTER — Other Ambulatory Visit (HOSPITAL_COMMUNITY): Payer: Self-pay

## 2023-09-13 ENCOUNTER — Other Ambulatory Visit (HOSPITAL_COMMUNITY): Payer: Self-pay

## 2023-09-14 ENCOUNTER — Other Ambulatory Visit (HOSPITAL_COMMUNITY): Payer: Self-pay

## 2023-09-14 MED ORDER — LOSARTAN POTASSIUM 100 MG PO TABS
100.0000 mg | ORAL_TABLET | Freq: Every day | ORAL | 0 refills | Status: DC
  Filled 2023-09-14: qty 7, 7d supply, fill #0

## 2023-09-17 ENCOUNTER — Other Ambulatory Visit (HOSPITAL_COMMUNITY): Payer: Self-pay

## 2023-09-21 ENCOUNTER — Other Ambulatory Visit (HOSPITAL_COMMUNITY): Payer: Self-pay

## 2023-09-21 MED ORDER — PANTOPRAZOLE SODIUM 40 MG PO TBEC
40.0000 mg | DELAYED_RELEASE_TABLET | Freq: Two times a day (BID) | ORAL | 0 refills | Status: AC
  Filled 2023-09-21: qty 30, 15d supply, fill #0

## 2023-09-22 ENCOUNTER — Other Ambulatory Visit (HOSPITAL_COMMUNITY): Payer: Self-pay

## 2023-09-23 ENCOUNTER — Other Ambulatory Visit (HOSPITAL_COMMUNITY): Payer: Self-pay

## 2023-09-24 ENCOUNTER — Other Ambulatory Visit (HOSPITAL_COMMUNITY): Payer: Self-pay

## 2023-09-24 DIAGNOSIS — F3177 Bipolar disorder, in partial remission, most recent episode mixed: Secondary | ICD-10-CM | POA: Diagnosis not present

## 2023-09-24 DIAGNOSIS — I10 Essential (primary) hypertension: Secondary | ICD-10-CM | POA: Diagnosis not present

## 2023-09-24 DIAGNOSIS — E7800 Pure hypercholesterolemia, unspecified: Secondary | ICD-10-CM | POA: Diagnosis not present

## 2023-09-24 DIAGNOSIS — K219 Gastro-esophageal reflux disease without esophagitis: Secondary | ICD-10-CM | POA: Diagnosis not present

## 2023-09-24 DIAGNOSIS — Z23 Encounter for immunization: Secondary | ICD-10-CM | POA: Diagnosis not present

## 2023-09-24 DIAGNOSIS — Z87820 Personal history of traumatic brain injury: Secondary | ICD-10-CM | POA: Diagnosis not present

## 2023-09-24 DIAGNOSIS — F17200 Nicotine dependence, unspecified, uncomplicated: Secondary | ICD-10-CM | POA: Diagnosis not present

## 2023-09-24 MED ORDER — LOSARTAN POTASSIUM 100 MG PO TABS
100.0000 mg | ORAL_TABLET | Freq: Every day | ORAL | 1 refills | Status: AC
  Filled 2023-10-04: qty 90, 90d supply, fill #0
  Filled 2023-12-21: qty 90, 90d supply, fill #1

## 2023-09-24 MED ORDER — ATORVASTATIN CALCIUM 40 MG PO TABS
40.0000 mg | ORAL_TABLET | Freq: Every day | ORAL | 1 refills | Status: AC
  Filled 2023-09-24: qty 90, 90d supply, fill #0
  Filled 2023-12-27: qty 90, 90d supply, fill #1

## 2023-09-24 MED ORDER — PANTOPRAZOLE SODIUM 40 MG PO TBEC
40.0000 mg | DELAYED_RELEASE_TABLET | Freq: Two times a day (BID) | ORAL | 1 refills | Status: AC
  Filled 2023-10-05: qty 180, 90d supply, fill #0

## 2023-09-25 ENCOUNTER — Other Ambulatory Visit (HOSPITAL_COMMUNITY): Payer: Self-pay

## 2023-09-27 ENCOUNTER — Other Ambulatory Visit (HOSPITAL_COMMUNITY): Payer: Self-pay

## 2023-09-27 ENCOUNTER — Other Ambulatory Visit: Payer: Self-pay

## 2023-09-27 MED ORDER — DEXTROMETHORPHAN-QUINIDINE 20-10 MG PO CAPS
1.0000 | ORAL_CAPSULE | Freq: Two times a day (BID) | ORAL | 1 refills | Status: AC
  Filled 2023-09-27: qty 60, 30d supply, fill #0
  Filled 2023-10-23: qty 60, 30d supply, fill #1

## 2023-09-29 ENCOUNTER — Other Ambulatory Visit (HOSPITAL_COMMUNITY): Payer: Self-pay

## 2023-10-01 ENCOUNTER — Other Ambulatory Visit: Payer: Self-pay

## 2023-10-01 ENCOUNTER — Other Ambulatory Visit (HOSPITAL_COMMUNITY): Payer: Self-pay

## 2023-10-01 MED ORDER — CARBAMAZEPINE 200 MG PO TABS
200.0000 mg | ORAL_TABLET | Freq: Two times a day (BID) | ORAL | 0 refills | Status: DC
  Filled 2023-10-01: qty 60, 30d supply, fill #0

## 2023-10-04 ENCOUNTER — Other Ambulatory Visit (HOSPITAL_COMMUNITY): Payer: Self-pay

## 2023-10-05 ENCOUNTER — Other Ambulatory Visit: Payer: Self-pay

## 2023-10-05 ENCOUNTER — Other Ambulatory Visit (HOSPITAL_COMMUNITY): Payer: Self-pay

## 2023-10-11 ENCOUNTER — Other Ambulatory Visit (HOSPITAL_COMMUNITY): Payer: Self-pay

## 2023-10-11 MED ORDER — QUETIAPINE FUMARATE 400 MG PO TABS
400.0000 mg | ORAL_TABLET | ORAL | 2 refills | Status: AC
  Filled 2023-10-11: qty 30, 30d supply, fill #0

## 2023-10-11 MED ORDER — CARBAMAZEPINE 200 MG PO TABS
200.0000 mg | ORAL_TABLET | Freq: Two times a day (BID) | ORAL | 2 refills | Status: DC
  Filled 2023-10-11 – 2023-10-23 (×2): qty 60, 30d supply, fill #0
  Filled 2023-11-27: qty 60, 30d supply, fill #1
  Filled 2023-12-21: qty 60, 30d supply, fill #2

## 2023-10-11 MED ORDER — NUEDEXTA 20-10 MG PO CAPS
1.0000 | ORAL_CAPSULE | Freq: Two times a day (BID) | ORAL | 2 refills | Status: AC
  Filled 2023-10-11: qty 60, 30d supply, fill #0

## 2023-10-12 DIAGNOSIS — F482 Pseudobulbar affect: Secondary | ICD-10-CM | POA: Diagnosis not present

## 2023-10-12 DIAGNOSIS — F639 Impulse disorder, unspecified: Secondary | ICD-10-CM | POA: Diagnosis not present

## 2023-10-12 DIAGNOSIS — F0630 Mood disorder due to known physiological condition, unspecified: Secondary | ICD-10-CM | POA: Diagnosis not present

## 2023-10-23 ENCOUNTER — Other Ambulatory Visit (HOSPITAL_COMMUNITY): Payer: Self-pay

## 2023-10-24 ENCOUNTER — Other Ambulatory Visit (HOSPITAL_COMMUNITY): Payer: Self-pay

## 2023-10-26 ENCOUNTER — Other Ambulatory Visit (HOSPITAL_COMMUNITY): Payer: Self-pay

## 2023-10-28 ENCOUNTER — Other Ambulatory Visit: Payer: Self-pay

## 2023-10-28 ENCOUNTER — Other Ambulatory Visit (HOSPITAL_COMMUNITY): Payer: Self-pay

## 2023-10-29 ENCOUNTER — Other Ambulatory Visit (HOSPITAL_COMMUNITY): Payer: Self-pay

## 2023-10-29 MED ORDER — OMEPRAZOLE 40 MG PO CPDR
40.0000 mg | DELAYED_RELEASE_CAPSULE | Freq: Two times a day (BID) | ORAL | 3 refills | Status: DC
  Filled 2023-10-29: qty 180, 90d supply, fill #0
  Filled 2024-02-03: qty 180, 90d supply, fill #1

## 2023-11-06 ENCOUNTER — Other Ambulatory Visit (HOSPITAL_COMMUNITY): Payer: Self-pay

## 2023-11-08 ENCOUNTER — Other Ambulatory Visit (HOSPITAL_COMMUNITY): Payer: Self-pay

## 2023-11-10 ENCOUNTER — Other Ambulatory Visit (HOSPITAL_COMMUNITY): Payer: Self-pay

## 2023-11-21 ENCOUNTER — Other Ambulatory Visit (HOSPITAL_COMMUNITY): Payer: Self-pay

## 2023-11-22 ENCOUNTER — Other Ambulatory Visit (HOSPITAL_COMMUNITY): Payer: Self-pay

## 2023-11-22 MED ORDER — NUEDEXTA 20-10 MG PO CAPS
1.0000 | ORAL_CAPSULE | Freq: Two times a day (BID) | ORAL | 2 refills | Status: AC
  Filled 2023-11-22: qty 60, 30d supply, fill #0
  Filled 2023-12-21: qty 60, 30d supply, fill #1
  Filled 2024-01-21 – 2024-01-28 (×2): qty 60, 30d supply, fill #2

## 2023-11-28 ENCOUNTER — Other Ambulatory Visit (HOSPITAL_COMMUNITY): Payer: Self-pay

## 2023-11-29 ENCOUNTER — Other Ambulatory Visit (HOSPITAL_COMMUNITY): Payer: Self-pay

## 2023-11-29 ENCOUNTER — Other Ambulatory Visit: Payer: Self-pay

## 2023-11-30 ENCOUNTER — Other Ambulatory Visit (HOSPITAL_COMMUNITY): Payer: Self-pay

## 2023-12-17 ENCOUNTER — Other Ambulatory Visit (HOSPITAL_COMMUNITY): Payer: Self-pay

## 2023-12-22 ENCOUNTER — Other Ambulatory Visit: Payer: Self-pay

## 2023-12-22 ENCOUNTER — Other Ambulatory Visit (HOSPITAL_COMMUNITY): Payer: Self-pay

## 2023-12-23 ENCOUNTER — Other Ambulatory Visit (HOSPITAL_COMMUNITY): Payer: Self-pay

## 2023-12-27 ENCOUNTER — Other Ambulatory Visit (HOSPITAL_COMMUNITY): Payer: Self-pay

## 2023-12-28 ENCOUNTER — Other Ambulatory Visit (HOSPITAL_COMMUNITY): Payer: Self-pay

## 2023-12-28 MED ORDER — NUEDEXTA 20-10 MG PO CAPS
1.0000 | ORAL_CAPSULE | Freq: Two times a day (BID) | ORAL | 2 refills | Status: AC
  Filled 2023-12-28: qty 60, 30d supply, fill #0

## 2023-12-28 MED ORDER — QUETIAPINE FUMARATE 400 MG PO TABS
400.0000 mg | ORAL_TABLET | Freq: Every day | ORAL | 2 refills | Status: AC
  Filled 2023-12-28 – 2024-02-04 (×2): qty 30, 30d supply, fill #0

## 2023-12-28 MED ORDER — CARBAMAZEPINE 200 MG PO TABS
200.0000 mg | ORAL_TABLET | Freq: Two times a day (BID) | ORAL | 2 refills | Status: AC
  Filled 2023-12-28 – 2024-01-28 (×2): qty 60, 30d supply, fill #0
  Filled 2024-02-23: qty 60, 30d supply, fill #1

## 2023-12-29 ENCOUNTER — Other Ambulatory Visit (HOSPITAL_COMMUNITY): Payer: Self-pay

## 2024-01-02 ENCOUNTER — Other Ambulatory Visit (HOSPITAL_COMMUNITY): Payer: Self-pay

## 2024-01-03 ENCOUNTER — Other Ambulatory Visit (HOSPITAL_COMMUNITY): Payer: Self-pay

## 2024-01-22 ENCOUNTER — Other Ambulatory Visit (HOSPITAL_COMMUNITY): Payer: Self-pay

## 2024-01-24 ENCOUNTER — Other Ambulatory Visit (HOSPITAL_COMMUNITY): Payer: Self-pay

## 2024-01-28 ENCOUNTER — Other Ambulatory Visit (HOSPITAL_COMMUNITY): Payer: Self-pay

## 2024-01-28 ENCOUNTER — Other Ambulatory Visit: Payer: Self-pay

## 2024-02-03 ENCOUNTER — Other Ambulatory Visit (HOSPITAL_COMMUNITY): Payer: Self-pay

## 2024-02-04 ENCOUNTER — Other Ambulatory Visit (HOSPITAL_COMMUNITY): Payer: Self-pay

## 2024-02-04 ENCOUNTER — Other Ambulatory Visit: Payer: Self-pay

## 2024-02-08 ENCOUNTER — Other Ambulatory Visit (HOSPITAL_COMMUNITY): Payer: Self-pay

## 2024-02-23 ENCOUNTER — Other Ambulatory Visit: Payer: Self-pay

## 2024-02-23 ENCOUNTER — Other Ambulatory Visit (HOSPITAL_COMMUNITY): Payer: Self-pay
